# Patient Record
Sex: Female | Born: 1935 | Race: White | Hispanic: No | Smoking: Former smoker
Health system: Southern US, Community
[De-identification: ages and names within clinical notes are randomized; demographics above are authoritative.]

## PROBLEM LIST (undated history)

## (undated) DIAGNOSIS — R311 Benign essential microscopic hematuria: Secondary | ICD-10-CM

## (undated) DIAGNOSIS — F329 Major depressive disorder, single episode, unspecified: Secondary | ICD-10-CM

## (undated) DIAGNOSIS — D638 Anemia in other chronic diseases classified elsewhere: Secondary | ICD-10-CM

## (undated) DIAGNOSIS — C801 Malignant (primary) neoplasm, unspecified: Secondary | ICD-10-CM

## (undated) DIAGNOSIS — E890 Postprocedural hypothyroidism: Secondary | ICD-10-CM

## (undated) DIAGNOSIS — E079 Disorder of thyroid, unspecified: Secondary | ICD-10-CM

## (undated) DIAGNOSIS — E785 Hyperlipidemia, unspecified: Secondary | ICD-10-CM

## (undated) DIAGNOSIS — C8290 Follicular lymphoma, unspecified, unspecified site: Secondary | ICD-10-CM

## (undated) DIAGNOSIS — J449 Chronic obstructive pulmonary disease, unspecified: Secondary | ICD-10-CM

## (undated) DIAGNOSIS — E042 Nontoxic multinodular goiter: Secondary | ICD-10-CM

## (undated) DIAGNOSIS — I10 Essential (primary) hypertension: Secondary | ICD-10-CM

## (undated) DIAGNOSIS — N6320 Unspecified lump in the left breast, unspecified quadrant: Secondary | ICD-10-CM

## (undated) DIAGNOSIS — J9611 Chronic respiratory failure with hypoxia: Secondary | ICD-10-CM

## (undated) DIAGNOSIS — H2589 Other age-related cataract: Secondary | ICD-10-CM

## (undated) DIAGNOSIS — D020 Carcinoma in situ of larynx: Secondary | ICD-10-CM

## (undated) DIAGNOSIS — D649 Anemia, unspecified: Secondary | ICD-10-CM

## (undated) DIAGNOSIS — J181 Lobar pneumonia, unspecified organism: Secondary | ICD-10-CM

## (undated) HISTORY — PX: ABDOMINAL HYSTERECTOMY: SHX81

## (undated) HISTORY — DX: Malignant (primary) neoplasm, unspecified: C80.1

## (undated) HISTORY — DX: Follicular lymphoma, unspecified, unspecified site: C82.90

## (undated) HISTORY — DX: Major depressive disorder, single episode, unspecified: F32.9

## (undated) HISTORY — DX: Other age-related cataract: H25.89

## (undated) HISTORY — DX: Unspecified lump in the left breast, unspecified quadrant: N63.20

## (undated) HISTORY — DX: Postprocedural hypothyroidism: E89.0

## (undated) HISTORY — DX: Disorder of thyroid, unspecified: E07.9

## (undated) HISTORY — PX: TUBAL LIGATION: SHX77

## (undated) HISTORY — DX: Hyperlipidemia, unspecified: E78.5

## (undated) HISTORY — DX: Benign essential microscopic hematuria: R31.1

## (undated) HISTORY — DX: Anemia in other chronic diseases classified elsewhere: D63.8

## (undated) HISTORY — DX: Carcinoma in situ of larynx: D02.0

## (undated) HISTORY — DX: Anemia, unspecified: D64.9

## (undated) HISTORY — PX: THYROIDECTOMY: SHX17

## (undated) HISTORY — DX: Chronic obstructive pulmonary disease, unspecified: J44.9

## (undated) HISTORY — DX: Chronic respiratory failure with hypoxia: J96.11

## (undated) HISTORY — DX: Essential (primary) hypertension: I10

## (undated) HISTORY — DX: Nontoxic multinodular goiter: E04.2

## (undated) HISTORY — DX: Lobar pneumonia, unspecified organism: J18.1

---

## 2008-01-16 DIAGNOSIS — E042 Nontoxic multinodular goiter: Secondary | ICD-10-CM

## 2008-01-16 HISTORY — DX: Nontoxic multinodular goiter: E04.2

## 2011-06-12 DIAGNOSIS — I10 Essential (primary) hypertension: Secondary | ICD-10-CM

## 2011-06-12 DIAGNOSIS — R311 Benign essential microscopic hematuria: Secondary | ICD-10-CM

## 2011-06-12 HISTORY — DX: Essential (primary) hypertension: I10

## 2011-06-12 HISTORY — DX: Benign essential microscopic hematuria: R31.1

## 2011-08-10 DIAGNOSIS — R490 Dysphonia: Secondary | ICD-10-CM | POA: Insufficient documentation

## 2011-11-24 DIAGNOSIS — C320 Malignant neoplasm of glottis: Secondary | ICD-10-CM | POA: Insufficient documentation

## 2012-02-02 DIAGNOSIS — F329 Major depressive disorder, single episode, unspecified: Secondary | ICD-10-CM | POA: Insufficient documentation

## 2012-02-02 HISTORY — DX: Major depressive disorder, single episode, unspecified: F32.9

## 2015-11-07 DIAGNOSIS — H2589 Other age-related cataract: Secondary | ICD-10-CM

## 2015-11-07 HISTORY — DX: Other age-related cataract: H25.89

## 2016-03-08 DIAGNOSIS — R531 Weakness: Secondary | ICD-10-CM | POA: Insufficient documentation

## 2016-03-08 DIAGNOSIS — K922 Gastrointestinal hemorrhage, unspecified: Secondary | ICD-10-CM | POA: Insufficient documentation

## 2016-03-08 DIAGNOSIS — D500 Iron deficiency anemia secondary to blood loss (chronic): Secondary | ICD-10-CM | POA: Insufficient documentation

## 2016-08-04 DIAGNOSIS — C252 Malignant neoplasm of tail of pancreas: Secondary | ICD-10-CM | POA: Insufficient documentation

## 2016-08-04 DIAGNOSIS — N6320 Unspecified lump in the left breast, unspecified quadrant: Secondary | ICD-10-CM | POA: Insufficient documentation

## 2016-08-04 HISTORY — DX: Unspecified lump in the left breast, unspecified quadrant: N63.20

## 2016-08-21 DIAGNOSIS — J9621 Acute and chronic respiratory failure with hypoxia: Secondary | ICD-10-CM | POA: Insufficient documentation

## 2016-08-26 DIAGNOSIS — C8290 Follicular lymphoma, unspecified, unspecified site: Secondary | ICD-10-CM

## 2016-08-26 HISTORY — DX: Follicular lymphoma, unspecified, unspecified site: C82.90

## 2017-09-28 ENCOUNTER — Ambulatory Visit (INDEPENDENT_AMBULATORY_CARE_PROVIDER_SITE_OTHER): Admitting: Physician Assistant

## 2017-09-28 ENCOUNTER — Encounter: Payer: Self-pay | Admitting: Physician Assistant

## 2017-09-28 VITALS — BP 129/68 | HR 92 | Temp 98.3°F | Resp 18 | Ht 65.0 in | Wt 115.0 lb

## 2017-09-28 DIAGNOSIS — M1990 Unspecified osteoarthritis, unspecified site: Secondary | ICD-10-CM | POA: Insufficient documentation

## 2017-09-28 DIAGNOSIS — J449 Chronic obstructive pulmonary disease, unspecified: Secondary | ICD-10-CM | POA: Insufficient documentation

## 2017-09-28 DIAGNOSIS — E785 Hyperlipidemia, unspecified: Secondary | ICD-10-CM

## 2017-09-28 DIAGNOSIS — D020 Carcinoma in situ of larynx: Secondary | ICD-10-CM

## 2017-09-28 DIAGNOSIS — D500 Iron deficiency anemia secondary to blood loss (chronic): Secondary | ICD-10-CM

## 2017-09-28 DIAGNOSIS — J9611 Chronic respiratory failure with hypoxia: Secondary | ICD-10-CM

## 2017-09-28 DIAGNOSIS — Z87440 Personal history of urinary (tract) infections: Secondary | ICD-10-CM

## 2017-09-28 DIAGNOSIS — Z515 Encounter for palliative care: Secondary | ICD-10-CM

## 2017-09-28 DIAGNOSIS — R630 Anorexia: Secondary | ICD-10-CM

## 2017-09-28 DIAGNOSIS — R531 Weakness: Secondary | ICD-10-CM

## 2017-09-28 DIAGNOSIS — Z7689 Persons encountering health services in other specified circumstances: Secondary | ICD-10-CM

## 2017-09-28 DIAGNOSIS — E890 Postprocedural hypothyroidism: Secondary | ICD-10-CM

## 2017-09-28 DIAGNOSIS — F331 Major depressive disorder, recurrent, moderate: Secondary | ICD-10-CM

## 2017-09-28 HISTORY — DX: Carcinoma in situ of larynx: D02.0

## 2017-09-28 HISTORY — DX: Hyperlipidemia, unspecified: E78.5

## 2017-09-28 MED ORDER — MIRTAZAPINE 7.5 MG PO TABS: 7.5000 mg | ORAL_TABLET | Freq: Every day | ORAL | 0 refills | Status: DC

## 2017-09-28 MED ORDER — NONFORMULARY OR COMPOUNDED ITEM: 0 refills | Status: AC

## 2017-09-28 NOTE — Progress Notes (Signed)
HPI:                                                                Sheri Contreras is a 84 y.o. 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  She was most recently hospitalized on 07/30/17 for COPD exacerbation and CAP. She is currently doing well on home O2 5-8L, Breo, and Duonebs 4-5 times daily. Continues to smoke 1/2 ppd.  She is currently being treated with Bactrim (day 3) for catheter-associated UTI. Denies any dysuria, frequency, urgency, hesitancy. Denies fever, nightsweats or altered mental status  Main concern today is generalized weakness, poor appetite and difficulty sleeping. Son states she naps 1-2 times during the day and sleeps through the night 4-6 hours. Patient perceives that she is not sleeping well because she feels chronically fatigued. Current bedtime medications are Haldol 2 mg and Gabapentin 100 mg.   Depression screen PHQ 2/9 09/28/2017  Decreased Interest 2  Down, Depressed, Hopeless 2  PHQ - 2 Score 4  Altered sleeping 3  Tired, decreased energy 3  Change in appetite 1  Feeling bad or failure about yourself  3  Trouble concentrating 3  Moving slowly or fidgety/restless 1  Suicidal thoughts 0  PHQ-9 Score 18    GAD 7 : Generalized Anxiety Score 09/28/2017  Nervous, Anxious, on Edge 3  Control/stop worrying 3  Worry too much - different things 3  Trouble relaxing 3  Restless 3  Easily annoyed or irritable 3  Afraid - awful might happen 2  Total GAD 7 Score 20      History reviewed. No pertinent past medical history. Past Surgical History:  Procedure Laterality Date  . ABDOMINAL HYSTERECTOMY     . THYROIDECTOMY    . TUBAL LIGATION     Social History   Tobacco Use  . Smoking status: Former Smoker    Types: Cigarettes    Last attempt to quit: 07/28/2016    Years since quitting: 1.2  . Smokeless tobacco: Current User    Types: Snuff  Substance Use Topics  . Alcohol use: Not Currently   family history is not on file.    ROS: Review of Systems  Constitutional: Positive for malaise/fatigue.  Respiratory: Positive for shortness of breath and wheezing.   Gastrointestinal: Positive for nausea and vomiting.  Neurological: Positive for weakness.  Endo/Heme/Allergies: Bruises/bleeds easily.  Psychiatric/Behavioral: The patient is nervous/anxious and has insomnia.      Medications: Current Outpatient Medications  Medication Sig Dispense Refill  . fluticasone furoate-vilanterol (BREO ELLIPTA) 200-25 MCG/INH AEPB Inhale 1 puff into the lungs daily.    . haloperidol (HALDOL) 2 MG tablet Take 2 mg by mouth at bedtime.    Marland Kitchen ipratropium-albuterol (DUONEB) 0.5-2.5 (3) MG/3ML SOLN INHALE 1 VIAL VIA NEBULIZER FOUR TIMES DAILY AS NEEDED    . NONFORMULARY OR COMPOUNDED ITEM Portable battery oxygen concentrator    . NONFORMULARY OR COMPOUNDED ITEM Oxygen  and nasal cannula    . ondansetron (ZOFRAN-ODT) 4 MG disintegrating tablet Take 4 mg by mouth every 8 (eight) hours as needed.    Marland Kitchen Respiratory Therapy Supplies (NEBULIZER COMPRESSOR) KIT Use a directed    . Respiratory Therapy Supplies (NEBULIZER/ADULT MASK) KIT Use as directed    . senna (SENOKOT) 8.6 MG tablet Take 8.6 mg by mouth as needed.    Marland Kitchen albuterol (VENTOLIN HFA) 108 (90 Base) MCG/ACT inhaler Inhale into the lungs.    . gabapentin (NEURONTIN) 100 MG capsule Take 1 capsule (100 mg total) by mouth at bedtime. 90 capsule 1  . levothyroxine (SYNTHROID, LEVOTHROID) 75 MCG tablet Take 1 tablet (75 mcg total) by mouth daily before breakfast. 90 tablet 1  . loperamide (ANTI-DIARRHEAL) 2 MG tablet Take by mouth as needed.    Marland Kitchen  LORazepam (ATIVAN) 0.5 MG tablet Take 1 tablet (0.5 mg total) by mouth once as needed for up to 1 dose for anxiety (DO NOT USE FOR SLEEP). 30 tablet 0  . mirtazapine (REMERON) 15 MG tablet Take 1 tablet (15 mg total) by mouth at bedtime. 30 tablet 3  . NONFORMULARY OR COMPOUNDED ITEM Pulse oximeter, for continuous home use 1 each 0  . promethazine (PHENERGAN) 25 MG tablet Take 25 mg by mouth as needed.     No current facility-administered medications for this visit.    No Known Allergies     Objective:  BP 129/68   Pulse 92   Temp 98.3 F (36.8 C)   Resp 18   Ht '5\' 5"'$  (1.651 m)   Wt 115 lb (52.2 kg)   SpO2 99%   BMI 19.14 kg/m  Gen:  alert, elderly female seated in a wheelchair with nasal cannula, appears frail, not toxic-appearing, no acute distress HEENT: head normocephalic without obvious abnormality, conjunctiva and cornea clear, trachea midline Pulm: Normal work of breathing on 5L continuous O2 nasal cannula, normal phonation, diffusely coarse breath sounds CV: Normal rate, regular rhythm, s1 and s2 distinct, no murmurs, clicks or rubs  Neuro: alert and oriented x 3, no tremor Skin: intact, no rashes on exposed skin, no jaundice, no cyanosis Psych: well-groomed, cooperative, good eye contact, depressed mood, affect mood-congruent, speech is articulate, and thought processes clear and goal-directed    No results found for this or any previous visit (from the past 72 hour(s)). No results found.    Assessment and Plan: 84 y.o. female with   Encounter to establish care  Postsurgical hypothyroidism - Plan: TSH  Blood loss anemia - Plan: CBC with Differential/Platelet  Weakness generalized - Plan: COMPLETE METABOLIC PANEL WITH GFR, CBC with Differential/Platelet, Urinalysis, Routine w reflex microscopic, Urine Culture, CANCELED: Urine Culture  History of UTI - Plan: Urine Culture  Poor appetite - Plan: DISCONTINUED: mirtazapine (REMERON) 7.5 MG tablet  Moderate  episode of recurrent major depressive disorder (HCC) - Plan: DISCONTINUED: mirtazapine (REMERON) 7.5 MG tablet  Chronic respiratory failure with hypoxia ([place]) - Plan: NONFORMULARY OR COMPOUNDED ITEM  Hospice care patient  Vitals reviewed and stable, SpO2 99% on 5L [place] PHQ9=18, no acute safety issues Adding low-dose Mirtazapine 7.5 mg QHS to help with depression and improve appetite Checking CBC, CMP, TSH Fatigue is likely multifactorial (chronic hypoxia, anemia of chronic disease) Contacting [facility] (hosp[facility]) to establish as PCP    Patient education and anticipatory guidance given Patient agrees with treatment plan Follow-up in 2 weeks for depression/poor appetite or sooner as needed if symptoms worsen or fail to improve  Charley E.  Maisie Fus PA-C

## 2017-09-28 NOTE — Patient Instructions (Addendum)
-   continue Haldol 2 mg and Gabapentin 100 mg at bedtime - start Mirtazapine 7.5 mg at bedtime for 1 week. If tolerating and still having sleep difficulty, increase to 2 tablets (15 mg) at bedtime

## 2017-09-29 LAB — CBC WITH DIFFERENTIAL/PLATELET
BASOS ABS: 27 {cells}/uL (ref 0–200)
Basophils Relative: 0.3 %
EOS PCT: 2 %
Eosinophils Absolute: 178 cells/uL (ref 15–500)
HCT: 31.8 % — ABNORMAL LOW (ref 35.0–45.0)
Hemoglobin: 9.9 g/dL — ABNORMAL LOW (ref 11.7–15.5)
LYMPHS ABS: 2875 {cells}/uL (ref 850–3900)
MCH: 25 pg — ABNORMAL LOW (ref 27.0–33.0)
MCHC: 31.1 g/dL — AB (ref 32.0–36.0)
MCV: 80.3 fL (ref 80.0–100.0)
MPV: 10 fL (ref 7.5–12.5)
Monocytes Relative: 11.5 %
NEUTROS PCT: 53.9 %
Neutro Abs: 4797 cells/uL (ref 1500–7800)
PLATELETS: 362 10*3/uL (ref 140–400)
RBC: 3.96 10*6/uL (ref 3.80–5.10)
RDW: 19 % — AB (ref 11.0–15.0)
TOTAL LYMPHOCYTE: 32.3 %
WBC: 8.9 10*3/uL (ref 3.8–10.8)
WBCMIX: 1024 {cells}/uL — AB (ref 200–950)

## 2017-09-29 LAB — URINALYSIS, ROUTINE W REFLEX MICROSCOPIC
BACTERIA UA: NONE SEEN /HPF
BILIRUBIN URINE: NEGATIVE
Glucose, UA: NEGATIVE
Hyaline Cast: NONE SEEN /LPF
KETONES UR: NEGATIVE
Nitrite: NEGATIVE
Protein, ur: NEGATIVE
SPECIFIC GRAVITY, URINE: 1.019 (ref 1.001–1.03)
pH: 6.5 (ref 5.0–8.0)

## 2017-09-29 LAB — COMPLETE METABOLIC PANEL WITH GFR
AG RATIO: 1.1 (calc) (ref 1.0–2.5)
ALBUMIN MSPROF: 3.6 g/dL (ref 3.6–5.1)
ALT: 11 U/L (ref 6–29)
AST: 17 U/L (ref 10–35)
Alkaline phosphatase (APISO): 92 U/L (ref 33–130)
BUN / CREAT RATIO: 10 (calc) (ref 6–22)
BUN: 11 mg/dL (ref 7–25)
CALCIUM: 9.3 mg/dL (ref 8.6–10.4)
CO2: 27 mmol/L (ref 20–32)
CREATININE: 1.12 mg/dL — AB (ref 0.60–0.88)
Chloride: 100 mmol/L (ref 98–110)
GFR, EST AFRICAN AMERICAN: 52 mL/min/{1.73_m2} — AB (ref >=60)
GFR, Est Non African American: 45 mL/min/{1.73_m2} — ABNORMAL LOW (ref >=60)
Globulin: 3.3 g/dL (calc) (ref 1.9–3.7)
Glucose, Bld: 81 mg/dL (ref 65–99)
POTASSIUM: 3.4 mmol/L — AB (ref 3.5–5.3)
SODIUM: 136 mmol/L (ref 135–146)
TOTAL PROTEIN: 6.9 g/dL (ref 6.1–8.1)
Total Bilirubin: 0.2 mg/dL (ref 0.2–1.2)

## 2017-09-29 LAB — TEST AUTHORIZATION

## 2017-09-29 LAB — IRON,TIBC AND FERRITIN PANEL
%SAT: 14 % — AB (ref 16–45)
FERRITIN: 20 ng/mL (ref 16–288)
Iron: 43 ug/dL — ABNORMAL LOW (ref 45–160)
TIBC: 301 mcg/dL (calc) (ref 250–450)

## 2017-09-29 LAB — URINE CULTURE
MICRO NUMBER:: 90789645
RESULT: NO GROWTH
SPECIMEN QUALITY: ADEQUATE

## 2017-09-29 LAB — TSH: TSH: 3.18 mIU/L (ref 0.40–4.50)

## 2017-10-01 ENCOUNTER — Encounter: Payer: Self-pay | Admitting: Physician Assistant

## 2017-10-01 DIAGNOSIS — D638 Anemia in other chronic diseases classified elsewhere: Secondary | ICD-10-CM | POA: Insufficient documentation

## 2017-10-01 HISTORY — DX: Anemia in other chronic diseases classified elsewhere: D63.8

## 2017-10-01 NOTE — Progress Notes (Signed)
Chronic kidney disease is stable, potassium is a little low. Recommend increasing dietary potassium Anemia has improved. I would recommend continuing iron supplement at least 3 days per week Thyroid function is good. Continue current dose of Levothyroxine Urine culture did not grow any bacteria. This means the antibiotic is working and she should complete it. I also reached out to [facility] to let them know that I am taking over as her PCP. They should be reaching out to sign medical releases  Should patient ask about sources of dietary potassium: Vegetables including broccoli, peas, lima beans, tomatoes, potatoes (particularly their skins), sweet potatoes, and winter squash are all good sources of potassium. Fruits that contain significant amounts of potassium include citrus fruits, cantaloupe, bananas, kiwi, prunes, and apricots.

## 2017-10-04 ENCOUNTER — Other Ambulatory Visit: Payer: Self-pay

## 2017-10-04 ENCOUNTER — Other Ambulatory Visit: Payer: Self-pay | Admitting: Physician Assistant

## 2017-10-04 ENCOUNTER — Telehealth: Payer: Self-pay

## 2017-10-04 DIAGNOSIS — F331 Major depressive disorder, recurrent, moderate: Secondary | ICD-10-CM

## 2017-10-04 DIAGNOSIS — R630 Anorexia: Secondary | ICD-10-CM

## 2017-10-04 MED ORDER — LORAZEPAM 0.5 MG PO TABS: 0.5000 mg | ORAL_TABLET | Freq: Once | ORAL | 0 refills | Status: DC | PRN

## 2017-10-04 MED ORDER — MIRTAZAPINE 15 MG PO TABS: 15.0000 mg | ORAL_TABLET | Freq: Every day | ORAL | 3 refills | Status: DC

## 2017-10-04 MED ORDER — AMBULATORY NON FORMULARY MEDICATION: 0 refills | Status: DC

## 2017-10-04 MED ORDER — GABAPENTIN 100 MG PO CAPS: 100.0000 mg | ORAL_CAPSULE | Freq: Every day | ORAL | 1 refills | Status: DC

## 2017-10-04 MED ORDER — LEVOTHYROXINE SODIUM 75 MCG PO TABS: 75.0000 ug | ORAL_TABLET | Freq: Every day | ORAL | 1 refills | Status: DC

## 2017-10-04 NOTE — Telephone Encounter (Signed)
Hospice nurse [name] is asking for new orders.  Artesha is requesting to go back on .5 of alprazolam.  She was on the liquid when she was in the hospital.  They also pointed out that Dominica Severin picked up a Rx of trazodone that was prescribed by old PCP.  [name] said that they have been giving Jenness both trazodone and Remeron.  [name] said it seemed to be working fine for her but wanted to know should they continue to giver her both? -EH/RMA

## 2017-10-04 NOTE — Telephone Encounter (Signed)
Please discontinue Trazodone Only nighttime medications should be Haldol 2 mg, Gabapentin 100 mg and Remeron 7.5 mg I am refilling Lorazepam 0.5 mg. This should NOT be used for sleep. It should only be used as needed for severe anxiety. It should not be combined with any other sedating medications. This is especially important because of her chronic respiratory failure.

## 2017-10-04 NOTE — Telephone Encounter (Signed)
Hospice nurse advised of recommendations.

## 2017-10-05 NOTE — Telephone Encounter (Signed)
Error -EH/RMA

## 2017-10-12 ENCOUNTER — Ambulatory Visit: Payer: Medicare HMO | Admitting: Physician Assistant

## 2017-10-16 ENCOUNTER — Encounter: Payer: Self-pay | Admitting: Physician Assistant

## 2017-10-16 DIAGNOSIS — J9611 Chronic respiratory failure with hypoxia: Secondary | ICD-10-CM

## 2017-10-16 DIAGNOSIS — E890 Postprocedural hypothyroidism: Secondary | ICD-10-CM

## 2017-10-16 DIAGNOSIS — R630 Anorexia: Secondary | ICD-10-CM | POA: Insufficient documentation

## 2017-10-16 DIAGNOSIS — Z87440 Personal history of urinary (tract) infections: Secondary | ICD-10-CM | POA: Insufficient documentation

## 2017-10-16 DIAGNOSIS — Z515 Encounter for palliative care: Secondary | ICD-10-CM | POA: Insufficient documentation

## 2017-10-16 HISTORY — DX: Chronic respiratory failure with hypoxia: J96.11

## 2017-10-16 HISTORY — DX: Postprocedural hypothyroidism: E89.0

## 2017-10-19 ENCOUNTER — Ambulatory Visit (INDEPENDENT_AMBULATORY_CARE_PROVIDER_SITE_OTHER): Payer: Medicare HMO | Admitting: Physician Assistant

## 2017-10-19 ENCOUNTER — Encounter: Payer: Self-pay | Admitting: Physician Assistant

## 2017-10-19 VITALS — BP 158/74 | HR 97 | Resp 18 | Wt 126.0 lb

## 2017-10-19 DIAGNOSIS — F341 Dysthymic disorder: Secondary | ICD-10-CM

## 2017-10-19 DIAGNOSIS — F064 Anxiety disorder due to known physiological condition: Secondary | ICD-10-CM | POA: Insufficient documentation

## 2017-10-19 NOTE — Progress Notes (Signed)
HPI:                                                                Sheri Contreras is a 84 y.o. 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  She was started on Remeron 15 mg nightly 2 weeks ago for poor appetite and sleep difficulties. Reports she is tolerating this medication without difficulty and eating a little more. Current bedtime medications are Haldol 2 mg and Gabapentin 100 mg. Typical bedtime 1am, wakes around 9a-10a. Feels rested. Reports mood is "pretty good." Has not need any Lorazepam for anxiety. Not napping  She has not been using supplemental oxygen for the last week. Denies dyspnea, coughing, chest tightness or wheezing.  Depression screen [facility] 2/9 10/19/2017 09/28/2017  Decreased Interest 1 2  Down, Depressed, Hopeless 1 2  PHQ - 2 Score 2 4  Altered sleeping 3 3  Tired, decreased energy 3 3  Change in appetite 1 1  Feeling bad or failure about yourself  1 3  Trouble concentrating 1 3  Moving slowly or fidgety/restless 2 1  Suicidal thoughts 0 0  PHQ-9 Score 13 18    GAD 7 : Generalized Anxiety Score 10/19/2017 09/28/2017  Nervous, Anxious, on Edge 2 3  Control/stop worrying 2 3  Worry too much - different things 2 3  Trouble relaxing 2 3  Restless 2 3  Easily annoyed or irritable 2 3  Afraid - awful might happen 2 2  Total GAD 7 Score 14 20      History reviewed. No pertinent past medical history. Past Surgical History:  Procedure Laterality Date  . ABDOMINAL HYSTERECTOMY    . THYROIDECTOMY    . TUBAL LIGATION     Social History   Tobacco Use  . Smoking status: Former Smoker    Types: Cigarettes    Last attempt to quit:  07/28/2016    Years since quitting: 1.2  . Smokeless tobacco: Current User    Types: Snuff  Substance Use Topics  . Alcohol use: Not Currently   family history is not on file.    ROS: negative except as noted in the HPI  Medications: Current Outpatient Medications  Medication Sig Dispense Refill  . albuterol (VENTOLIN HFA) 108 (90 Base) MCG/ACT inhaler Inhale into the lungs.    . fluticasone furoate-vilanterol (BREO ELLIPTA) 200-25 MCG/INH AEPB Inhale 1 puff into the lungs daily.    Marland Kitchen gabapentin (NEURONTIN) 100 MG capsule Take 1 capsule (100 mg total) by mouth at bedtime. 90 capsule 1  . haloperidol (HALDOL) 2 MG tablet Take 2 mg by mouth at bedtime.    Marland Kitchen ipratropium-albuterol (DUONEB) 0.5-2.5 (3) MG/3ML SOLN INHALE 1 VIAL VIA NEBULIZER FOUR TIMES DAILY AS NEEDED    . levothyroxine (SYNTHROID, LEVOTHROID) 75 MCG tablet Take 1 tablet (75 mcg total) by mouth daily before breakfast. 90 tablet 1  . loperamide (ANTI-DIARRHEAL) 2 MG tablet Take by mouth as needed.    Marland Kitchen LORazepam (ATIVAN) 0.5  MG tablet Take 1 tablet (0.5 mg total) by mouth once as needed for up to 1 dose for anxiety (DO NOT USE FOR SLEEP). 30 tablet 0  . mirtazapine (REMERON) 15 MG tablet Take 1 tablet (15 mg total) by mouth at bedtime. 30 tablet 3  . NONFORMULARY OR COMPOUNDED ITEM Portable battery oxygen concentrator    . NONFORMULARY OR COMPOUNDED ITEM Oxygen and nasal cannula    . NONFORMULARY OR COMPOUNDED ITEM Pulse oximeter, for continuous home use 1 each 0  . ondansetron (ZOFRAN-ODT) 4 MG disintegrating tablet Take 4 mg by mouth every 8 (eight) hours as needed.    . promethazine (PHENERGAN) 25 MG tablet Take 25 mg by mouth as needed.    Marland Kitchen Respiratory Therapy Supplies (NEBULIZER COMPRESSOR) KIT Use a directed    . Respiratory Therapy Supplies (NEBULIZER/ADULT MASK) KIT Use as directed    . senna (SENOKOT) 8.6 MG tablet Take 8.6 mg by mouth as needed.     No current facility-administered medications for this visit.     No Known Allergies     Objective:  BP (!) 158/74   Pulse 97   Resp 18   Wt 126 lb (57.2 kg) Comment: wheelchair  SpO2 94%   BMI 20.97 kg/m  Gen:  alert, not ill-appearing, no distress, appropriate for age, seated in wheelchair HEENT: head normocephalic without obvious abnormality, conjunctiva and cornea clear, trachea midline Pulm: Normal work of breathing, normal phonation, breath sounds diffusely coarse, no wheezes, rales or rhonchi CV: Normal rate, regular rhythm, s1 and s2 distinct, no murmurs, clicks or rubs  Neuro: alert and oriented x 3, no tremor MSK: extremities atraumatic, normal gait and station Skin: intact, no rashes on exposed skin, no jaundice, no cyanosis Psych: well-groomed, cooperative, good eye contact, euthymic mood, affect mood-congruent, speech is articulate, and thought processes clear and goal-directed    No results found for this or any previous visit (from the past 72 hour(s)). No results found.    Assessment and Plan: 84 y.o. female with   Dysthymia  Anxiety disorder due to known physiological condition  PHQ9=13, no acute safety issues GAD7=14 Cont Remeron 15 mg QHS Cont Haldol 2 mg QHS Cont Gabapentin 100 mg QHS  SpO2 94% on RA at rest, no tachypnea. Continue supplemental O2 2-5L/min prn for SpO2 <90%  Patient education and anticipatory guidance given Patient agrees with treatment plan Follow-up in 3 months for medication management or sooner as needed if symptoms worsen or fail to improve  Darlyne Russian PA-C

## 2017-10-19 NOTE — Patient Instructions (Addendum)
Continue Bedtime Medications - Mirtazapine 15 mg - Haloperidol 2 mg - Gabapentin 100 mg

## 2017-10-27 ENCOUNTER — Other Ambulatory Visit: Payer: Self-pay | Admitting: Physician Assistant

## 2017-10-27 DIAGNOSIS — R630 Anorexia: Secondary | ICD-10-CM

## 2017-10-27 DIAGNOSIS — F331 Major depressive disorder, recurrent, moderate: Secondary | ICD-10-CM

## 2018-01-04 ENCOUNTER — Ambulatory Visit (INDEPENDENT_AMBULATORY_CARE_PROVIDER_SITE_OTHER): Payer: Medicare Other | Admitting: Physician Assistant

## 2018-01-04 ENCOUNTER — Encounter: Payer: Self-pay | Admitting: Physician Assistant

## 2018-01-04 VITALS — BP 131/62 | HR 89 | Temp 97.6°F | Ht 65.0 in | Wt 125.0 lb

## 2018-01-04 DIAGNOSIS — Z23 Encounter for immunization: Secondary | ICD-10-CM

## 2018-01-04 DIAGNOSIS — I10 Essential (primary) hypertension: Secondary | ICD-10-CM

## 2018-01-04 DIAGNOSIS — C252 Malignant neoplasm of tail of pancreas: Secondary | ICD-10-CM

## 2018-01-04 DIAGNOSIS — J411 Mucopurulent chronic bronchitis: Secondary | ICD-10-CM

## 2018-01-04 DIAGNOSIS — Z87440 Personal history of urinary (tract) infections: Secondary | ICD-10-CM

## 2018-01-04 DIAGNOSIS — R350 Frequency of micturition: Secondary | ICD-10-CM

## 2018-01-04 DIAGNOSIS — R109 Unspecified abdominal pain: Secondary | ICD-10-CM

## 2018-01-04 LAB — POCT URINALYSIS DIPSTICK
BILIRUBIN UA: NEGATIVE
GLUCOSE UA: NEGATIVE
KETONES UA: NEGATIVE
NITRITE UA: NEGATIVE
Protein, UA: POSITIVE — AB
Spec Grav, UA: 1.025 (ref 1.010–1.025)
UROBILINOGEN UA: 0.2 U/dL
pH, UA: 5.5 (ref 5.0–8.0)

## 2018-01-04 MED ORDER — NITROFURANTOIN MONOHYD MACRO 100 MG PO CAPS: 100.0000 mg | ORAL_CAPSULE | Freq: Two times a day (BID) | ORAL | 0 refills | Status: DC

## 2018-01-04 MED ORDER — HYDROCODONE-ACETAMINOPHEN 5-325 MG PO TABS: 1.0000 | ORAL_TABLET | Freq: Three times a day (TID) | ORAL | 0 refills | Status: AC | PRN

## 2018-01-04 MED ORDER — TAMSULOSIN HCL 0.4 MG PO CAPS: 0.4000 mg | ORAL_CAPSULE | Freq: Every day | ORAL | 0 refills | Status: AC

## 2018-01-04 MED ORDER — SULFAMETHOXAZOLE-TRIMETHOPRIM 800-160 MG PO TABS: 1.0000 | ORAL_TABLET | Freq: Two times a day (BID) | ORAL | 0 refills | Status: DC

## 2018-01-04 NOTE — Progress Notes (Signed)
Subjective:     Patient ID: Sheri Contreras, female   DOB: 1931/02/20, 84 y.o.   MRN: [id number]  HPI Patient is a 84 yo female with a history of metastatic pancreatic cancer, throat cancer, squamous cell carcinoma of the vocal cord, COPD, and UTIs who presents today complaining of low back pain and a possible UTI. Patient reports that she has had several UTIs before due to the fact that she had a catheter in May while was on hospice for pancreatic cancer. She is no longer on hospice and is living with her son. She states the pain started on Saturday and was mostly in her back. She states that the pain is in her left side flank and describes it as a sharp pain. She states on Sunday it was so severe that it was about a 9-10/10 and her son thought he was going to have to take her to the ER. Since then the pain has gone down to a 7/10 and she now feels some pain in her side. She denies any abdominal pain. She complains of some dysuria and urgency. She denies hematuria. She denies fever, nausea, or vomiting, but has had some diarrhea which she feels is normal for her. She hasn't taken anything for the pain.   .. Active Ambulatory Problems    Diagnosis Date Noted  . Acute on chronic respiratory failure with hypoxia ([place]) 08/21/2016  . Arthritis 09/28/2017  . Benign microscopic hematuria 06/12/2011  . Blood loss anemia 03/08/2016  . Breast mass, left 08/04/2016  . COPD (chronic obstructive pulmonary disease) ([place]) 09/28/2017  . Depression, major 02/02/2012  . Essential hypertension 06/12/2011  . Weakness generalized 03/08/2016  . Follicular lymphoma ([place]) 08/26/2016  . GI bleeding 03/08/2016  . Hoarseness 08/10/2011  . Hyperlipidemia 09/28/2017  . Nontoxic multinodular goiter 01/16/2008  . Other and combined forms of senile cataract 11/07/2015  . Malignant neoplasm of tail of pancreas ([place]) 08/04/2016  . Squamous cell carcinoma of vocal cord ([place]) 11/24/2011  . Throat cancer ([place]) 09/28/2017   . Anemia of chronic disease 10/01/2017  . Hospice care patient 10/16/2017  . Chronic respiratory failure with hypoxia ([place]) 10/16/2017  . Poor appetite 10/16/2017  . History of UTI 10/16/2017  . Postsurgical hypothyroidism 10/16/2017  . Dysthymia 10/19/2017  . Anxiety disorder due to known physiological condition 10/19/2017   Resolved Ambulatory Problems    Diagnosis Date Noted  . No Resolved Ambulatory Problems   No Additional Past Medical History    Review of Systems  All other systems reviewed and are negative.    Objective:   Physical Exam  Constitutional: She is oriented to person, place, and time.  HENT:  Head: Normocephalic and atraumatic.  Cardiovascular: Normal rate and regular rhythm.  Pulmonary/Chest: She has rales in the right lower field and the left lower field.  Abdominal: She exhibits no distension. There is no tenderness. There is CVA tenderness.  CVA test elicited more tenderness on left side compared to right.  Neurological: She is alert and oriented to person, place, and time.  Psychiatric: She has a normal mood and affect. Her behavior is normal.      Assessment:     Marland KitchenMarland KitchenDiagnoses and all orders for this visit:  Urine frequency -     POCT Urinalysis Dipstick -     Urine Culture -     sulfamethoxazole-trimethoprim (BACTRIM DS,SEPTRA DS) 800-160 MG tablet; Take 1 tablet by mouth 2 (two) times daily.  History of UTI -  sulfamethoxazole-trimethoprim (BACTRIM DS,SEPTRA DS) 800-160 MG tablet; Take 1 tablet by mouth 2 (two) times daily.  Malignant neoplasm of tail of pancreas (HCC)  Mucopurulent chronic bronchitis ([place])  Essential hypertension  Encounter for immunization -     Flu vaccine HIGH DOSE PF  Left flank pain -     tamsulosin (FLOMAX) 0.4 MG CAPS capsule; Take 1 capsule (0.4 mg total) by mouth daily. -     HYDROcodone-acetaminophen (NORCO/VICODIN) 5-325 MG tablet; Take 1-2 tablets by mouth every 8 (eight) hours as needed for up to 5  days for moderate pain. -     sulfamethoxazole-trimethoprim (BACTRIM DS,SEPTRA DS) 800-160 MG tablet; Take 1 tablet by mouth 2 (two) times daily.  Need for influenza vaccination -     Flu vaccine HIGH DOSE PF  Other orders -     Discontinue: nitrofurantoin, macrocrystal-monohydrate, (MACROBID) 100 MG capsule; Take 1 capsule (100 mg total) by mouth 2 (two) times daily.      .. Results for orders placed or performed in visit on 01/04/18  POCT Urinalysis Dipstick  Result Value Ref Range   Color, UA yellow    Clarity, UA clear    Glucose, UA Negative Negative   Bilirubin, UA negative    Ketones, UA negative    Spec Grav, UA 1.025 1.010 - 1.025   Blood, UA moderate    pH, UA 5.5 5.0 - 8.0   Protein, UA Positive (A) Negative   Urobilinogen, UA 0.2 0.2 or 1.0 E.U./dL   Nitrite, UA negative    Leukocytes, UA Small (1+) (A) Negative   Appearance     Odor      Plan:    Dipstick done in office today showed moderate blood, positive for protein, and 1+ leukocytes. Going to treat patient for a UTI today. Bilateral crackles heard in lower lungs so treating with Bactrim to cover for UTI as well as any possible pneumonia.  Urine culture ordered.   Given patients presentation of left sided low back pain, going to prescribe Flomax to help with passage of any possible stones. Do not think imaging is warranted at this time given patient's age, presentation, and history of cancer. Also prescribed hydrocodone-acetaminophen to use as needed to help with pain.  If back pain persists after use of Bactrim and Flomax will likely need further evaluation to rule out kidney stone or metastatic cause. Advised patient to continue to drink lots of fluids which she states she is doing. Patient has an appointment in 2 weeks and can discuss symptoms then, but if symptoms worsen from now until then, patient should call the office.    Pt is still being seen by hospice once a week.   Marland KitchenVernetta Honey PA-C, have  reviewed and agree with the above documentation in it's entirety.

## 2018-01-04 NOTE — Patient Instructions (Signed)
Possible kidney stone- flomax for 15 days.   Urinary Tract Infection, Adult A urinary tract infection (UTI) is an infection of any part of the urinary tract. The urinary tract includes the:  Kidneys.  Ureters.  Bladder.  Urethra.  These organs make, store, and get rid of pee (urine) in the body. Follow these instructions at home:  Take over-the-counter and prescription medicines only as told by your doctor.  If you were prescribed an antibiotic medicine, take it as told by your doctor. Do not stop taking the antibiotic even if you start to feel better.  Avoid the following drinks: ? Alcohol. ? Caffeine. ? Tea. ? Carbonated drinks.  Drink enough fluid to keep your pee clear or pale yellow.  Keep all follow-up visits as told by your doctor. This is important.  Make sure to: ? Empty your bladder often and completely. Do not to hold pee for long periods of time. ? Empty your bladder before and after sex. ? Wipe from front to back after a bowel movement if you are female. Use each tissue one time when you wipe. Contact a doctor if:  You have back pain.  You have a fever.  You feel sick to your stomach (nauseous).  You throw up (vomit).  Your symptoms do not get better after 3 days.  Your symptoms go away and then come back. Get help right away if:  You have very bad back pain.  You have very bad lower belly (abdominal) pain.  You are throwing up and cannot keep down any medicines or water. This information is not intended to replace advice given to you by your health care provider. Make sure you discuss any questions you have with your health care provider. Document Released: 09/02/2007 Document Revised: 08/22/2015 Document Reviewed: 02/04/2015 Elsevier Interactive Patient Education  [employer].

## 2018-01-06 LAB — URINE CULTURE
MICRO NUMBER: 91209252
SPECIMEN QUALITY: ADEQUATE

## 2018-01-06 NOTE — Progress Notes (Signed)
Call pt: no bacteria to treat in urine culture.

## 2018-01-19 ENCOUNTER — Encounter: Payer: Self-pay | Admitting: Physician Assistant

## 2018-01-19 ENCOUNTER — Ambulatory Visit (INDEPENDENT_AMBULATORY_CARE_PROVIDER_SITE_OTHER): Payer: Medicare Other | Admitting: Physician Assistant

## 2018-01-19 VITALS — BP 146/75 | HR 90 | Wt 136.0 lb

## 2018-01-19 DIAGNOSIS — F341 Dysthymic disorder: Secondary | ICD-10-CM | POA: Diagnosis not present

## 2018-01-19 DIAGNOSIS — F064 Anxiety disorder due to known physiological condition: Secondary | ICD-10-CM | POA: Diagnosis not present

## 2018-01-19 DIAGNOSIS — M159 Polyosteoarthritis, unspecified: Secondary | ICD-10-CM

## 2018-01-19 DIAGNOSIS — M150 Primary generalized (osteo)arthritis: Secondary | ICD-10-CM | POA: Diagnosis not present

## 2018-01-19 MED ORDER — ACETAMINOPHEN ER 650 MG PO TBCR: 650.0000 mg | EXTENDED_RELEASE_TABLET | Freq: Three times a day (TID) | ORAL | 3 refills | Status: AC | PRN

## 2018-01-19 NOTE — Patient Instructions (Signed)

## 2018-01-19 NOTE — Progress Notes (Signed)
HPI:                                                                Sheri Contreras is a 84 y.o. female who presents to [facility]: [place] today for depression / anxiety follow-up  Sheri Contreras is doing great. She is able to ambulate without assistance. She no longer requires home oxygen. She is still on hospice care through [facility]. Lives at home with her son, Sheri Contreras.  Stable on Remeron, Haldol, and Gabapentin. Sleeping soundly through the night. She has no complaints regarding her mood or anxiety.  Reports she is having arthritis pain in her low back and shoulder. Not currently taking anything for pain.    Depression screen [employer] 2/9 01/19/2018 10/19/2017 09/28/2017  Decreased Interest '1 1 2  '$ Down, Depressed, Hopeless '1 1 2  '$ PHQ - 2 Score '2 2 4  '$ Altered sleeping 0 3 3  Tired, decreased energy '1 3 3  '$ Change in appetite 0 1 1  Feeling bad or failure about yourself  '1 1 3  '$ Trouble concentrating 0 1 3  Moving slowly or fidgety/restless '1 2 1  '$ Suicidal thoughts 0 0 0  PHQ-9 Score '5 13 18    '$ GAD 7 : Generalized Anxiety Score 01/19/2018 10/19/2017 09/28/2017  Nervous, Anxious, on Edge '2 2 3  '$ Control/stop worrying '1 2 3  '$ Worry too much - different things '1 2 3  '$ Trouble relaxing '1 2 3  '$ Restless '2 2 3  '$ Easily annoyed or irritable '1 2 3  '$ Afraid - awful might happen '1 2 2  '$ Total GAD 7 Score '9 14 20      '$ No past medical history on file. Past Surgical History:  Procedure Laterality Date  . ABDOMINAL HYSTERECTOMY    . THYROIDECTOMY    . TUBAL LIGATION     Social History   Tobacco Use  . Smoking status: Former Smoker    Types: Cigarettes    Last attempt to quit: 07/28/2016    Years since quitting: 1.4  . Smokeless tobacco: Current User    Types: Snuff  Substance Use Topics  . Alcohol use: Not Currently   family history is not on file.    ROS: negative except as noted in the HPI  Medications: Current Outpatient Medications  Medication Sig  Dispense Refill  . albuterol (VENTOLIN HFA) 108 (90 Base) MCG/ACT inhaler Inhale into the lungs.    . fluticasone furoate-vilanterol (BREO ELLIPTA) 200-25 MCG/INH AEPB Inhale 1 puff into the lungs daily.    Marland Kitchen gabapentin (NEURONTIN) 100 MG capsule Take 1 capsule (100 mg total) by mouth at bedtime. 90 capsule 1  . haloperidol (HALDOL) 2 MG tablet Take 2 mg by mouth at bedtime.    Marland Kitchen ipratropium-albuterol (DUONEB) 0.5-2.5 (3) MG/3ML SOLN INHALE 1 VIAL VIA NEBULIZER FOUR TIMES DAILY AS NEEDED    . levothyroxine (SYNTHROID, LEVOTHROID) 75 MCG tablet Take 1 tablet (75 mcg total) by mouth daily before breakfast. 90 tablet 1  . loperamide (ANTI-DIARRHEAL) 2 MG tablet Take by mouth as needed.    Marland Kitchen LORazepam (ATIVAN) 0.5 MG tablet Take 1 tablet (0.5 mg total) by mouth once as needed for up to 1 dose for anxiety (DO NOT USE FOR SLEEP). 30 tablet  0  . mirtazapine (REMERON) 15 MG tablet TAKE 1 TABLET BY MOUTH EVERYDAY AT BEDTIME 90 tablet 2  . NONFORMULARY OR COMPOUNDED ITEM Portable battery oxygen concentrator    . NONFORMULARY OR COMPOUNDED ITEM Oxygen and nasal cannula    . NONFORMULARY OR COMPOUNDED ITEM Pulse oximeter, for continuous home use 1 each 0  . ondansetron (ZOFRAN-ODT) 4 MG disintegrating tablet Take 4 mg by mouth every 8 (eight) hours as needed.    . promethazine (PHENERGAN) 25 MG tablet Take 25 mg by mouth as needed.    Marland Kitchen Respiratory Therapy Supplies (NEBULIZER COMPRESSOR) KIT Use a directed    . Respiratory Therapy Supplies (NEBULIZER/ADULT MASK) KIT Use as directed    . senna (SENOKOT) 8.6 MG tablet Take 8.6 mg by mouth as needed.    . sulfamethoxazole-trimethoprim (BACTRIM DS,SEPTRA DS) 800-160 MG tablet Take 1 tablet by mouth 2 (two) times daily. 14 tablet 0  . tamsulosin (FLOMAX) 0.4 MG CAPS capsule Take 1 capsule (0.4 mg total) by mouth daily. 15 capsule 0   No current facility-administered medications for this visit.    No Known Allergies     Objective:  BP (!) 146/75   Pulse  90   Wt 136 lb (61.7 kg)   BMI 22.63 kg/m  Gen:  alert, not ill-appearing, no distress, appropriate for age 12: head normocephalic without obvious abnormality, conjunctiva and cornea clear, trachea midline Pulm: Normal work of breathing, normal phonation, clear to auscultation bilaterally, no wheezes, rales or rhonchi CV: Normal rate, regular rhythm, s1 and s2 distinct, no murmurs, clicks or rubs  Neuro: alert and oriented x 3, no tremor MSK: extremities atraumatic, antalgic gait and station Back: atraumatic, no midline tenderness, mild tenderness of bilateral lower lumbar/sacral region Skin: intact, no rashes on exposed skin, no jaundice, no cyanosis Psych: well-groomed, cooperative, good eye contact, euthymic mood, affect mood-congruent, speech is articulate, and thought processes clear and goal-directed    No results found for this or any previous visit (from the past 72 hour(s)). No results found.    Assessment and Plan: 84 y.o. female with   .Diagnoses and all orders for this visit:  Dysthymia  Primary osteoarthritis involving multiple joints -     acetaminophen (TYLENOL) 650 MG CR tablet; Take 1-2 tablets (650-1,300 mg total) by mouth every 8 (eight) hours as needed for pain.  Anxiety disorder due to known physiological condition   Dysthymia/Anxiety PHQ9=5, mild; improved from 13 several months ago GAD7=9, mild-moderate, improved from 14 Cont Remeron 15 mg QHS, Haldol 2 mg QHS and Gabapentin 100 mg QHS Cont low-dose Lorazepam 0.5 mg prn for breakthrough anxiety. She is not using this for sleep  OA Start Tylenol CR [phone] Q8H prn for pain Will avoid NSAIDs due to age, history of GI bleed/anemia and CKD  Son would like to change her pharmacy. I instructed him to have old pharmacy transfer all prescriptions to new pharmacy.  Patient education and anticipatory guidance given Patient agrees with treatment plan Follow-up in 3 months or sooner as needed if  symptoms worsen or fail to improve  Darlyne Russian PA-C

## 2018-01-20 ENCOUNTER — Encounter: Payer: Self-pay | Admitting: Physician Assistant

## 2018-01-20 DIAGNOSIS — M159 Polyosteoarthritis, unspecified: Secondary | ICD-10-CM | POA: Insufficient documentation

## 2018-01-20 DIAGNOSIS — M150 Primary generalized (osteo)arthritis: Secondary | ICD-10-CM

## 2018-02-22 ENCOUNTER — Encounter: Payer: Self-pay | Admitting: Physician Assistant

## 2018-02-22 ENCOUNTER — Ambulatory Visit (INDEPENDENT_AMBULATORY_CARE_PROVIDER_SITE_OTHER): Payer: Medicare Other | Admitting: Physician Assistant

## 2018-02-22 ENCOUNTER — Ambulatory Visit (INDEPENDENT_AMBULATORY_CARE_PROVIDER_SITE_OTHER)

## 2018-02-22 VITALS — BP 127/70 | HR 89 | Temp 97.9°F | Wt 144.0 lb

## 2018-02-22 DIAGNOSIS — R3129 Other microscopic hematuria: Secondary | ICD-10-CM

## 2018-02-22 DIAGNOSIS — M79601 Pain in right arm: Secondary | ICD-10-CM

## 2018-02-22 DIAGNOSIS — R82998 Other abnormal findings in urine: Secondary | ICD-10-CM

## 2018-02-22 DIAGNOSIS — E890 Postprocedural hypothyroidism: Secondary | ICD-10-CM

## 2018-02-22 DIAGNOSIS — Z8719 Personal history of other diseases of the digestive system: Secondary | ICD-10-CM

## 2018-02-22 DIAGNOSIS — R0902 Hypoxemia: Secondary | ICD-10-CM | POA: Diagnosis not present

## 2018-02-22 DIAGNOSIS — R5383 Other fatigue: Secondary | ICD-10-CM

## 2018-02-22 DIAGNOSIS — R918 Other nonspecific abnormal finding of lung field: Secondary | ICD-10-CM

## 2018-02-22 DIAGNOSIS — R5381 Other malaise: Secondary | ICD-10-CM

## 2018-02-22 DIAGNOSIS — J411 Mucopurulent chronic bronchitis: Secondary | ICD-10-CM | POA: Diagnosis not present

## 2018-02-22 DIAGNOSIS — J9611 Chronic respiratory failure with hypoxia: Secondary | ICD-10-CM

## 2018-02-22 DIAGNOSIS — J441 Chronic obstructive pulmonary disease with (acute) exacerbation: Secondary | ICD-10-CM

## 2018-02-22 LAB — COMPLETE METABOLIC PANEL WITH GFR
AG RATIO: 1.4 (calc) (ref 1.0–2.5)
ALT: 6 U/L (ref 6–29)
AST: 10 U/L (ref 10–35)
Albumin: 3.9 g/dL (ref 3.6–5.1)
Alkaline phosphatase (APISO): 101 U/L (ref 33–130)
BUN/Creatinine Ratio: 14 (calc) (ref 6–22)
BUN: 13 mg/dL (ref 7–25)
CALCIUM: 9.3 mg/dL (ref 8.6–10.4)
CO2: 33 mmol/L — ABNORMAL HIGH (ref 20–32)
Chloride: 101 mmol/L (ref 98–110)
Creat: 0.93 mg/dL — ABNORMAL HIGH (ref 0.60–0.88)
GFR, Est African American: 65 mL/min/{1.73_m2} (ref >=60)
GFR, Est Non African American: 56 mL/min/{1.73_m2} — ABNORMAL LOW (ref >=60)
GLOBULIN: 2.8 g/dL (ref 1.9–3.7)
Glucose, Bld: 106 mg/dL — ABNORMAL HIGH (ref 65–99)
POTASSIUM: 3.6 mmol/L (ref 3.5–5.3)
SODIUM: 138 mmol/L (ref 135–146)
Total Bilirubin: 0.3 mg/dL (ref 0.2–1.2)
Total Protein: 6.7 g/dL (ref 6.1–8.1)

## 2018-02-22 LAB — POCT URINALYSIS DIPSTICK
BILIRUBIN UA: NEGATIVE
GLUCOSE UA: NEGATIVE
Ketones, UA: NEGATIVE
Nitrite, UA: NEGATIVE
Protein, UA: NEGATIVE
Spec Grav, UA: 1.02 (ref 1.010–1.025)
Urobilinogen, UA: 0.2 E.U./dL
pH, UA: 5.5 (ref 5.0–8.0)

## 2018-02-22 LAB — CBC WITH DIFFERENTIAL/PLATELET
BASOS ABS: 27 {cells}/uL (ref 0–200)
Basophils Relative: 0.3 %
EOS ABS: 117 {cells}/uL (ref 15–500)
Eosinophils Relative: 1.3 %
HEMATOCRIT: 37 % (ref 35.0–45.0)
Hemoglobin: 12 g/dL (ref 11.7–15.5)
LYMPHS ABS: 2331 {cells}/uL (ref 850–3900)
MCH: 27.5 pg (ref 27.0–33.0)
MCHC: 32.4 g/dL (ref 32.0–36.0)
MCV: 84.7 fL (ref 80.0–100.0)
MPV: 9.2 fL (ref 7.5–12.5)
Monocytes Relative: 14.5 %
NEUTROS PCT: 58 %
Neutro Abs: 5220 cells/uL (ref 1500–7800)
Platelets: 337 10*3/uL (ref 140–400)
RBC: 4.37 10*6/uL (ref 3.80–5.10)
RDW: 15.4 % — AB (ref 11.0–15.0)
Total Lymphocyte: 25.9 %
WBC: 9 10*3/uL (ref 3.8–10.8)
WBCMIX: 1305 {cells}/uL — AB (ref 200–950)

## 2018-02-22 LAB — TSH+FREE T4: TSH W/REFLEX TO FT4: 2.64 mIU/L (ref 0.40–4.50)

## 2018-02-22 MED ORDER — AMOXICILLIN-POT CLAVULANATE 875-125 MG PO TABS: 1.0000 | ORAL_TABLET | Freq: Two times a day (BID) | ORAL | 0 refills | Status: DC

## 2018-02-22 MED ORDER — METHYLPREDNISOLONE SODIUM SUCC 125 MG IJ SOLR
125.0000 mg | Freq: Once | INTRAMUSCULAR | Status: AC
  Administered 2018-02-22: 125 mg via INTRAMUSCULAR

## 2018-02-22 MED ORDER — FLUTICASONE-UMECLIDIN-VILANT 100-62.5-25 MCG/INH IN AEPB: 1.0000 | INHALATION_SPRAY | Freq: Every day | RESPIRATORY_TRACT | 5 refills | Status: AC

## 2018-02-22 MED ORDER — METHYLPREDNISOLONE ACETATE 40 MG/ML IJ SUSP
40.0000 mg | Freq: Once | INTRAMUSCULAR | Status: AC
  Administered 2018-02-22: 40 mg via INTRAMUSCULAR

## 2018-02-22 NOTE — Patient Instructions (Addendum)
Stop Breo and switch to Trelegy inhaler for COPD Start home O2, 2L, and adjust so that pulse ox is 92% or above Start antibiotic - this will cover you for respiratory infection, ear infection and urinary tract infection  You should be receiving your portable O2 from [facility] this week. Please contact us Friday if you have not heard anything   Chronic Obstructive Pulmonary Disease Exacerbation Chronic obstructive pulmonary disease (COPD) is a common lung problem. In COPD, the flow of air from the lungs is limited. COPD exacerbations are times that breathing gets worse and you need extra treatment. Without treatment they can be life threatening. If they happen often, your lungs can become more damaged. If your COPD gets worse, your doctor may treat you with:  Medicines.  Oxygen.  Different ways to clear your airway, such as using a mask.  Follow these instructions at home:  Do not smoke.  Avoid tobacco smoke and other things that bother your lungs.  If given, take your antibiotic medicine as told. Finish the medicine even if you start to feel better.  Only take medicines as told by your doctor.  Drink enough fluids to keep your pee (urine) clear or pale yellow (unless your doctor has told you not to).  Use a cool mist machine (vaporizer).  If you use oxygen or a machine that turns liquid medicine into a mist (nebulizer), continue to use them as told.  Keep up with shots (vaccinations) as told by your doctor.  Exercise regularly.  Eat healthy foods.  Keep all doctor visits as told. Get help right away if:  You are very short of breath and it gets worse.  You have trouble talking.  You have bad chest pain.  You have blood in your spit (sputum).  You have a fever.  You keep throwing up (vomiting).  You feel weak, or you pass out (faint).  You feel confused.  You keep getting worse. This information is not intended to replace advice given to you by your health care  provider. Make sure you discuss any questions you have with your health care provider. Document Released: 03/05/2011 Document Revised: 08/22/2015 Document Reviewed: 11/18/2012 Elsevier Interactive Patient Education  2017 [employer].

## 2018-02-22 NOTE — Progress Notes (Signed)
HPI:                                                                Sheri Contreras is a 84 y.o. female who presents to [facility]: [place] today for URI symptoms and right arm pain  URI   This is a new problem. The current episode started more than 1 month ago. The problem has been gradually worsening. There has been no fever. Associated symptoms include congestion, coughing (productive of sputum), ear pain (right), nausea (yesterday), rhinorrhea, a sore throat (x 2 weeks) and wheezing. Pertinent negatives include no abdominal pain, chest pain, diarrhea, dysuria, headaches, rash or vomiting. Associated symptoms comments: + shortness of breath. She has tried acetaminophen Nurse, adult) for the symptoms. The treatment provided mild relief.  Arm Pain   The incident occurred more than 1 week ago. There was no injury mechanism. The pain is present in the upper right arm. The quality of the pain is described as aching. The pain does not radiate. The pain is moderate. The pain has been constant since the incident. Associated symptoms include muscle weakness. Pertinent negatives include no chest pain, numbness or tingling. Associated symptoms comments: + decreased ROM. The symptoms are aggravated by movement and lifting. She has tried acetaminophen for the symptoms. The treatment provided mild relief.     Past Medical History:  Diagnosis Date  . Anemia   . Cancer ([place])   . COPD (chronic obstructive pulmonary disease) ([place])   . Thyroid disease    Past Surgical History:  Procedure Laterality Date  . ABDOMINAL HYSTERECTOMY    . THYROIDECTOMY    . TUBAL LIGATION     Social History   Tobacco Use  . Smoking status: Former Smoker    Types: Cigarettes    Last attempt to quit: 07/28/2016    Years since quitting: 1.5  . Smokeless tobacco: Current User    Types: Snuff  Substance Use Topics  . Alcohol use: Not Currently   Family history is unknown by  patient.    ROS: negative except as noted in the HPI  Medications: Current Outpatient Medications  Medication Sig Dispense Refill  . acetaminophen (TYLENOL) 650 MG CR tablet Take 1-2 tablets (650-1,300 mg total) by mouth every 8 (eight) hours as needed for pain. 90 tablet 3  . albuterol (VENTOLIN HFA) 108 (90 Base) MCG/ACT inhaler Inhale into the lungs.    Marland Kitchen amoxicillin-clavulanate (AUGMENTIN) 875-125 MG tablet Take 1 tablet by mouth 2 (two) times daily. 20 tablet 0  . Fluticasone-Umeclidin-Vilant (TRELEGY ELLIPTA) 100-62.5-25 MCG/INH AEPB Inhale 1 puff into the lungs daily. 60 each 5  . gabapentin (NEURONTIN) 100 MG capsule Take 1 capsule (100 mg total) by mouth at bedtime. 90 capsule 1  . haloperidol (HALDOL) 2 MG tablet Take 2 mg by mouth at bedtime.    Marland Kitchen ipratropium-albuterol (DUONEB) 0.5-2.5 (3) MG/3ML SOLN INHALE 1 VIAL VIA NEBULIZER FOUR TIMES DAILY AS NEEDED    . levothyroxine (SYNTHROID, LEVOTHROID) 75 MCG tablet Take 1 tablet (75 mcg total) by mouth daily before breakfast. 90 tablet 1  . loperamide (ANTI-DIARRHEAL) 2 MG tablet Take by mouth as needed.    Marland Kitchen LORazepam (ATIVAN) 0.5 MG tablet Take 1 tablet (0.5 mg total) by  mouth once as needed for up to 1 dose for anxiety (DO NOT USE FOR SLEEP). 30 tablet 0  . mirtazapine (REMERON) 15 MG tablet TAKE 1 TABLET BY MOUTH EVERYDAY AT BEDTIME 90 tablet 2  . NONFORMULARY OR COMPOUNDED ITEM Portable battery oxygen concentrator    . NONFORMULARY OR COMPOUNDED ITEM Oxygen and nasal cannula    . NONFORMULARY OR COMPOUNDED ITEM Pulse oximeter, for continuous home use 1 each 0  . ondansetron (ZOFRAN-ODT) 4 MG disintegrating tablet Take 4 mg by mouth every 8 (eight) hours as needed.    . promethazine (PHENERGAN) 25 MG tablet Take 25 mg by mouth as needed.    Marland Kitchen Respiratory Therapy Supplies (NEBULIZER COMPRESSOR) KIT Use a directed    . Respiratory Therapy Supplies (NEBULIZER/ADULT MASK) KIT Use as directed    . senna (SENOKOT) 8.6 MG tablet Take  8.6 mg by mouth as needed.    . tamsulosin (FLOMAX) 0.4 MG CAPS capsule Take 1 capsule (0.4 mg total) by mouth daily. 15 capsule 0   No current facility-administered medications for this visit.    No Known Allergies     Objective:  BP 127/70   Pulse 89   Temp 97.9 F (36.6 C) (Oral)   Wt 144 lb (65.3 kg)   SpO2 91%   BMI 23.96 kg/m  Gen:  alert, not ill-appearing, no distress, appropriate for age 81: head normocephalic without obvious abnormality, conjunctiva and cornea clear, right TM slightly dark and opaque, no erythema or bulging, no mastoid tenderness, oropharynx clear, moist mucous membranes, neck supple, no cervical adenopathy, trachea midline Pulm: Normal work of breathing, normal phonation, breath sounds diffusely diminished, no wheezes, rales or rhonchi CV: Normal rate, regular rhythm, s1 and s2 distinct, no murmurs, clicks or rubs  Neuro: alert and oriented x 3, no tremor MSK: extremities atraumatic, normal gait and station Right upper extremity: atraumatic, no tenderness or deformity, positive Neers sign, decreased shoulder flexion to 110 degrees Skin: intact, no rashes on exposed skin, no jaundice, no cyanosis     Results for orders placed or performed in visit on 02/22/18 (from the past 72 hour(s))  POCT Urinalysis Dipstick     Status: Abnormal   Collection Time: 02/22/18 10:05 AM  Result Value Ref Range   Color, UA yellow    Clarity, UA clear    Glucose, UA Negative Negative   Bilirubin, UA negative    Ketones, UA negative    Spec Grav, UA 1.020 1.010 - 1.025   Blood, UA moderate    pH, UA 5.5 5.0 - 8.0   Protein, UA Negative Negative   Urobilinogen, UA 0.2 0.2 or 1.0 E.U./dL   Nitrite, UA negative    Leukocytes, UA Small (1+) (A) Negative   Appearance     Odor     No results found.    Assessment and Plan: 84 y.o. female with   .Diagnoses and all orders for this visit:  Chronic respiratory failure with hypoxia (HCC)  Malaise and  fatigue -     DG Chest 2 View -     CBC with Differential/Platelet -     COMPLETE METABOLIC PANEL WITH GFR -     TSH + free T4  Hypoxia -     DG Chest 2 View -     CBC with Differential/Platelet  Mucopurulent chronic bronchitis (HCC) -     DG Chest 2 View -     Fluticasone-Umeclidin-Vilant (TRELEGY ELLIPTA) 100-62.5-25 MCG/INH AEPB; Inhale 1 puff into  the lungs daily.  History of GI bleed -     CBC with Differential/Platelet  Postsurgical hypothyroidism -     TSH + free T4  Microscopic hematuria -     POCT Urinalysis Dipstick -     Urine Culture  Urine leukocytes -     amoxicillin-clavulanate (AUGMENTIN) 875-125 MG tablet; Take 1 tablet by mouth 2 (two) times daily. -     POCT Urinalysis Dipstick -     Urine Culture  Chronic obstructive pulmonary disease with acute exacerbation (HCC) -     amoxicillin-clavulanate (AUGMENTIN) 875-125 MG tablet; Take 1 tablet by mouth 2 (two) times daily.  Pain of right upper extremity -     Ambulatory referral to Home Health   Acute on chronic respiratory failure, Malaise/Fatigue Afebrile, no tachypnea, no tachycardia, hypoxic with pulse ox 91% on RA at rest Patient states she is not using her home oxygen because the supplier took away her portable O2 Contacted [facility] and they will deliver a new device this week In the meantime, I have instructed her to use her non-portable home oxygen starting 2L nasal cannula and self-titrating to pulse ox>=92% Given that patient has had generalized malaise and URI symptoms for >1 month, will obtain infection work-up to include CXR, CBC w diff, CMP, urine culture. She also complains of constant cold chills, checking TSH UA positive for moderate blood and small leuks. She has a history of asymptomatic benign microscopic hematuria. Augmentin prescribed to cover both respiratory and genitourinary pathogens  Pain of right upper extremity Pain in the biceps area. Patient is convinced this was due to  administration of her flu shot over 1 month ago. Differential includes adhesive capsulitis, biceps tendonitis. Recommend home PT and follow-up in 4 weeks  Patient education and anticipatory guidance given Patient agrees with treatment plan Follow-up in 3 days or sooner as needed if symptoms worsen or fail to improve  Darlyne Russian PA-C

## 2018-02-23 ENCOUNTER — Telehealth: Payer: Self-pay | Admitting: Physician Assistant

## 2018-02-23 LAB — URINE CULTURE
MICRO NUMBER: 91424947
SPECIMEN QUALITY: ADEQUATE

## 2018-02-23 NOTE — Telephone Encounter (Signed)
[name], can you provide clarification on the home health PT referral?  We need to figure out how to get her access to PT.  [name], can you contact [facility] today? Let them know one of their patients needs rehab for right arm pain and ask how we can order this for them

## 2018-02-23 NOTE — Telephone Encounter (Signed)
Sheri Contreras   I spoke with Rise Paganini the team lead at [facility] and she stated that in order for the patient to have in home rehab she would have to come off of the hospice program. They are able to go do an evaluation and maybe 1-2 visits and they do go out and train family how to lift and move patients  but are not able to set up 2 weeks of rehab. She did state that you could look at other interventions for this but if the patient is needing rehab it is not a qualified hospice service. - CF

## 2018-02-23 NOTE — Progress Notes (Signed)
Chest x-ray shows a probable pneumonia in the right lung. The antibiotic she received should treat this. I would recommend we repeat a chest x-ray in 4 weeks to ensure this has resolved.  I would also like her to come back into the office in 1 week to make sure she is improving clinically.

## 2018-02-23 NOTE — Telephone Encounter (Signed)
Error

## 2018-02-23 NOTE — Telephone Encounter (Signed)
Let patient know we are unable to get home health PT for the below reasons. I would recommend she return to see Sports Medicine regarding her arm pain. They could give her instruction on home rehab exercises and pain management

## 2018-02-28 ENCOUNTER — Ambulatory Visit (INDEPENDENT_AMBULATORY_CARE_PROVIDER_SITE_OTHER): Admitting: Physician Assistant

## 2018-02-28 ENCOUNTER — Encounter: Payer: Self-pay | Admitting: Physician Assistant

## 2018-02-28 VITALS — BP 152/65 | HR 82 | Temp 97.9°F | Wt 143.0 lb

## 2018-02-28 DIAGNOSIS — J181 Lobar pneumonia, unspecified organism: Secondary | ICD-10-CM

## 2018-02-28 DIAGNOSIS — J9611 Chronic respiratory failure with hypoxia: Secondary | ICD-10-CM

## 2018-02-28 DIAGNOSIS — Z8719 Personal history of other diseases of the digestive system: Secondary | ICD-10-CM | POA: Insufficient documentation

## 2018-02-28 DIAGNOSIS — J189 Pneumonia, unspecified organism: Secondary | ICD-10-CM

## 2018-02-28 NOTE — Progress Notes (Signed)
HPI:                                                                Sheri Contreras is a 84 y.o. 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 She was treated empirically with Augmentin for possible co-existing UTI.  She was also switched from Anoro to Trelegy for her COPD.  CBC, CMP, TSH were unremarkable. Urine culture was negative.   Reports she is feeling much better.  She has not needed her home oxygen.  She denies fever. chills, chest pain, hemoptysis.  Additionally she had also complained of right upper arm pain.  We had referred her for home health physical therapy, but unfortunately her hospice agency [facility] stated she was not eligible for PT. She states pain has completely resolved without treatment.  Past Medical History:  Diagnosis Date  . Anemia   . Cancer ([place])   . COPD (chronic obstructive pulmonary disease) ([place])   . Thyroid disease    Past Surgical History:  Procedure Laterality Date  . ABDOMINAL HYSTERECTOMY    . THYROIDECTOMY    . TUBAL LIGATION     Social History   Tobacco Use  . Smoking status: Former Smoker    Types: Cigarettes    Last attempt to quit: 07/28/2016    Years since quitting: 1.5  . Smokeless tobacco: Current User    Types: Snuff  Substance Use Topics  . Alcohol use: Not Currently   Family history is unknown by patient.    ROS: negative except as noted in the HPI  Medications: Current Outpatient Medications  Medication  Sig Dispense Refill  . acetaminophen (TYLENOL) 650 MG CR tablet Take 1-2 tablets (650-1,300 mg total) by mouth every 8 (eight) hours as needed for pain. 90 tablet 3  . albuterol (VENTOLIN HFA) 108 (90 Base) MCG/ACT inhaler Inhale into the lungs.    Marland Kitchen amoxicillin-clavulanate (AUGMENTIN) 875-125 MG tablet Take 1 tablet by mouth 2 (two) times daily. 20 tablet 0  . Fluticasone-Umeclidin-Vilant (TRELEGY ELLIPTA) 100-62.5-25 MCG/INH AEPB Inhale 1 puff into the lungs daily. 60 each 5  . gabapentin (NEURONTIN) 100 MG capsule Take 1 capsule (100 mg total) by mouth at bedtime. 90 capsule 1  . haloperidol (HALDOL) 2 MG tablet Take 2 mg by mouth at bedtime.    Marland Kitchen ipratropium-albuterol (DUONEB) 0.5-2.5 (3) MG/3ML SOLN INHALE 1 VIAL VIA NEBULIZER FOUR TIMES DAILY AS NEEDED    . levothyroxine (SYNTHROID, LEVOTHROID) 75 MCG tablet Take 1 tablet (75 mcg total) by mouth daily before breakfast. 90 tablet 1  . loperamide (ANTI-DIARRHEAL) 2 MG tablet Take by mouth as needed.    Marland Kitchen LORazepam (ATIVAN) 0.5 MG tablet Take 1 tablet (0.5 mg total) by mouth once as needed for up to 1 dose for anxiety (DO NOT USE FOR SLEEP). 30 tablet 0  . mirtazapine (  REMERON) 15 MG tablet TAKE 1 TABLET BY MOUTH EVERYDAY AT BEDTIME 90 tablet 2  . NONFORMULARY OR COMPOUNDED ITEM Portable battery oxygen concentrator    . NONFORMULARY OR COMPOUNDED ITEM Oxygen and nasal cannula    . NONFORMULARY OR COMPOUNDED ITEM Pulse oximeter, for continuous home use 1 each 0  . ondansetron (ZOFRAN-ODT) 4 MG disintegrating tablet Take 4 mg by mouth every 8 (eight) hours as needed.    . promethazine (PHENERGAN) 25 MG tablet Take 25 mg by mouth as needed.    Marland Kitchen Respiratory Therapy Supplies (NEBULIZER COMPRESSOR) KIT Use a directed    . Respiratory Therapy Supplies (NEBULIZER/ADULT MASK) KIT Use as directed    . senna (SENOKOT) 8.6 MG tablet Take 8.6 mg by mouth as needed.    . tamsulosin (FLOMAX) 0.4 MG CAPS capsule Take 1 capsule (0.4 mg total) by mouth daily.  15 capsule 0   No current facility-administered medications for this visit.    No Known Allergies     Objective:  BP (!) 152/65   Pulse 82   Temp 97.9 F (36.6 C) (Oral)   Wt 143 lb (64.9 kg)   SpO2 94%   BMI 23.80 kg/m  Gen:  alert, not ill-appearing, no distress, appropriate for age 72: head normocephalic without obvious abnormality, conjunctiva and cornea clear, trachea midline Pulm: Normal work of breathing, normal phonation, breath sounds diffusely diminished, no wheezes, rales or rhonchi CV: Normal rate, regular rhythm, s1 and s2 distinct, no murmurs, clicks or rubs  Neuro: alert and oriented x 3, no tremor MSK: extremities atraumatic, normal gait and station Skin: intact, no rashes on exposed skin, no jaundice, no cyanosis  Study Result   CLINICAL DATA:  Productive cough for several weeks  EXAM: CHEST - 2 VIEW  COMPARISON:  None.  FINDINGS: Cardiac shadows within normal limits. The lungs are hyperinflated consistent with COPD. Mild diffuse interstitial changes are noted likely of a chronic nature. A more focal area of increased density is noted anteriorly on the lateral projection. This is not well appreciated on the frontal film but may represent a focal infiltrate in the lingula or right middle lobe. No sizable effusion is seen. No bony abnormality is noted.  IMPRESSION: Focal area of increased density best seen anteriorly on the lateral projection. This may lie within the medial aspect of the right middle lobe although not well seen on the frontal projection.  Followup PA and lateral chest X-ray is recommended in 3-4 weeks following trial of antibiotic therapy to ensure resolution and exclude underlying malignancy.   Electronically Signed   By: Inez Catalina M.D.   On: 02/22/2018 10:49     No results found for this or any previous visit (from the past 72 hour(s)). No results found.    Assessment and Plan: 84 y.o. female with    .Sheri Contreras was seen today for follow-up.  Diagnoses and all orders for this visit:  Community acquired pneumonia of right middle lobe of lung ([place]) -     DG Chest 2 View; Future  Chronic respiratory failure with hypoxia ([facility])   Afebrile, no tachypnea, no tachycardia, pulse ox 94% on room air at rest, this is improved from 91% on 02/22/2018.  Clinically improved. Complete full 10-day course of Augmentin Continue Trelegy for COPD Repeat chest x-ray to assess for resolution of infiltrate in 1 month    Patient education and anticipatory guidance given Patient agrees with treatment plan Follow-up in 1 month or sooner as needed if  symptoms worsen or fail to improve  Darlyne Russian PA-C

## 2018-02-28 NOTE — Telephone Encounter (Signed)
Discussed during office visit today -EH/RMA

## 2018-02-28 NOTE — Patient Instructions (Addendum)
Return for repeat Chest x-ray in 4 weeks   Chronic Respiratory Failure Respiratory failure is a condition in which the lungs do not work well and the breathing (respiratory) system fails. When respiratory failure occurs, it becomes difficult for the lungs to get enough oxygen or to eliminate carbon dioxide or to do both duties. If the lungs do not work properly, the heart, brain, and other body systems do not get enough oxygen. Respiratory failure is life-threatening if it is not treated. Respiratory failure can be acute or chronic. Acute respiratory failure is sudden and severe and requires emergency medical treatment. Chronic respiratory failure happens over time, usually due to a medical condition that gets worse. What are the causes? This condition may be caused by any problem that affects the heart or lungs. Causes include:  Chronic bronchitis and emphysema (COPD).  Pulmonary fibrosis.  Water in the lungs due to heart failure, lung injury, or infection (pulmonary edema).  Asthma.  Nerve or muscle diseases that make chest movements difficult, such as Leta Baptist disease or Guillain-Barre syndrome.  A collapsed lung (pneumothorax).  Pulmonary hypertension.  Chronic sleep apnea.  Pneumonia.  Obesity.  A blood clot in a lung (pulmonary embolism).  Trauma to the chest that makes breathing difficult.  What increases the risk? You are more likely to develop this condition if:  You are a smoker, or have a history of smoking.  You have a weak immune system.  You have a family history of breathing problems or lung disease.  You have a long term lung disease such as COPD.  What are the signs or symptoms? Symptoms of this condition include:  Shortness of breath with or without activity.  Difficulty breathing.  Wheezing.  A fast or irregular heartbeat (arrhythmia).  Chest pain or tightness.  A bluish color to the fingernail or toenail beds  (cyanosis).  Confusion.  Drowsiness.  Extreme fatigue, especially with minimal activity.  How is this diagnosed? This condition may be diagnosed based on:  Your medical history.  A physical exam.  Other tests, such as: ? A chest X-ray. ? A CT scan of your lungs. ? Blood tests, such as an arterial blood gas test. This test is done to check if you have enough oxygen in your blood. ? An electrocardiogram. This test records the electrical activity of your heart. ? An echocardiogram. This test uses sound waves to produce an image of your heart.  A check of your blood pressure, heart rate, breathing rate, and blood oxygen level.  How is this treated? Treatment for this condition depends on the cause. Treatment can include the following:  Getting oxygen through a nasal cannula. This is a tube that goes in your nose.  Getting oxygen through a face mask.  Receiving noninvasive positive pressure ventilation. This is a method of breathing support in which a machine blows air into your lungs through a mask. The machine allows you to breathe on your own. It helps the body take in oxygen and eliminate carbon dioxide.  Using a ventilator. This is a breathing machine that delivers oxygen to the lungs through a breathing tube that is put into the trachea. This machine is used when you can no longer breathe well enough on your own.  Medicines to help with breathing, such as: ? Medicines that open up and relax air passages, such as bronchodilators. These may be given through a device that turns liquid medicines into a mist you can breathe in (nebulizer). These medicines  help with breathing. ? Diuretics. These medicines get rid of extra fluid out of your lungs, which can help you breathe better. ? Steroid medicines. These decrease inflammation in the lungs. ? Antibiotic medicines. These may be given to treat a bacterial infection, such as pneumonia.  Pulmonary rehabilitation. This is an exercise  program that strengthens the muscles in your chest and helps you learn breathing techniques in order to manage your condition.  Follow these instructions at home: Medicines  Take over-the-counter and prescription medicines only as told by your health care provider.  If you were prescribed an antibiotic medicine, take it as told by your health care provider. Do not stop taking the antibiotic even if you start to feel better. General instructions  Use oxygen therapy and pulmonary rehabilitation if directed to by your health care provider. If you require home oxygen therapy, ask your health care provider whether you should purchase a pulse oximeter to measure your oxygen level at home.  Work with your health care provider to create a plan to help you deal with your condition. Follow this plan.  Do not use any products that contain nicotine or tobacco, such as cigarettes and e-cigarettes. If you need help quitting, ask your health care provider.  Avoid exposure to irritants that make your breathing problems worse. These include smoke, chemicals, and fumes.  Stay active, but balance activity with periods of rest. Exercise and physical activity will help you maintain your ability to do things you want to do.  Stay up to date on all vaccines, especially yearly influenza and pneumonia vaccines.  Avoid people who are sick as well as crowded places during the flu season.  Keep all follow-up visits as told by your health care provider. This is important. Contact a health care provider if:  Your shortness of breath gets worse and you cannot do the things you used to do.  You have increased mucus (sputum), wheezing, coughing, or loss of energy.  You are on oxygen therapy and you are starting to need more.  You need to use your medicines more often.  You have a fever. Get help right away if:  Your shortness of breath becomes worse.  You are unable to say more than a few words without having  to catch your breath.  You develop chest pain or tightness. Summary  Respiratory failure is a condition in which the lungs do not work well and the breathing system fails.  This condition can be very serious and is often life-threatening.  This condition is diagnosed with tests and can be treated with medicines or oxygen.  Contact a health care provider if your shortness of breath gets worse or if you need to use your oxygen or medicines more often than before. This information is not intended to replace advice given to you by your health care provider. Make sure you discuss any questions you have with your health care provider. Document Released: 03/16/2005 Document Revised: 03/27/2016 Document Reviewed: 03/27/2016 Elsevier Interactive Patient Education  2017 [employer].

## 2018-03-02 DIAGNOSIS — J181 Lobar pneumonia, unspecified organism: Principal | ICD-10-CM

## 2018-03-02 DIAGNOSIS — J189 Pneumonia, unspecified organism: Secondary | ICD-10-CM | POA: Insufficient documentation

## 2018-03-02 HISTORY — DX: Pneumonia, unspecified organism: J18.9

## 2018-04-21 ENCOUNTER — Ambulatory Visit (INDEPENDENT_AMBULATORY_CARE_PROVIDER_SITE_OTHER): Payer: Medicare Other

## 2018-04-21 ENCOUNTER — Ambulatory Visit (INDEPENDENT_AMBULATORY_CARE_PROVIDER_SITE_OTHER): Payer: Medicare Other | Admitting: Physician Assistant

## 2018-04-21 ENCOUNTER — Encounter: Payer: Self-pay | Admitting: Physician Assistant

## 2018-04-21 VITALS — BP 113/76 | HR 89 | Temp 97.9°F | Wt 143.0 lb

## 2018-04-21 DIAGNOSIS — L989 Disorder of the skin and subcutaneous tissue, unspecified: Secondary | ICD-10-CM

## 2018-04-21 DIAGNOSIS — J449 Chronic obstructive pulmonary disease, unspecified: Secondary | ICD-10-CM | POA: Diagnosis not present

## 2018-04-21 DIAGNOSIS — J029 Acute pharyngitis, unspecified: Secondary | ICD-10-CM | POA: Diagnosis not present

## 2018-04-21 DIAGNOSIS — J189 Pneumonia, unspecified organism: Secondary | ICD-10-CM

## 2018-04-21 DIAGNOSIS — J181 Lobar pneumonia, unspecified organism: Principal | ICD-10-CM

## 2018-04-21 DIAGNOSIS — R918 Other nonspecific abnormal finding of lung field: Secondary | ICD-10-CM | POA: Diagnosis not present

## 2018-04-21 DIAGNOSIS — H9201 Otalgia, right ear: Secondary | ICD-10-CM

## 2018-04-21 MED ORDER — IPRATROPIUM BROMIDE 0.06 % NA SOLN: 2.0000 | Freq: Four times a day (QID) | NASAL | 0 refills | Status: DC | PRN

## 2018-04-21 MED ORDER — TRIAMCINOLONE ACETONIDE 0.5 % EX OINT: 1.0000 "application " | TOPICAL_OINTMENT | Freq: Two times a day (BID) | CUTANEOUS | 0 refills | Status: AC

## 2018-04-21 MED ORDER — MAGIC MOUTHWASH W/LIDOCAINE: 15.0000 mL | Freq: Four times a day (QID) | ORAL | 0 refills | Status: AC | PRN

## 2018-04-21 NOTE — Patient Instructions (Addendum)
Go downstairs for chest x-ray today  For sore throat: - Rinse mouth really well after using Trelegy - Use Magic Mouthwash - swish, gargle and spit 30 ml every 4-6 hours as needed - Tylenol 1000mg  every 8 hours as needed for throat pain. Alternate with Ibuprofen 600mg  every 6 hours - Cepacol throat lozenges and/or Chloraseptic spray - Warm salt water gargles - Use nasal spray up to 4 times daily

## 2018-04-21 NOTE — Progress Notes (Signed)
HPI:                                                                PURVI RUEHL is a 85 y.o. 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 She was treated empirically with Augmentin for possible co-existing UTI.  She was also switched from Anoro to Trelegy for her COPD. She did not have her follow-up chest x-ray completed  Today she c/o of approx 3 weeks of right ear pain and right-sided throat pain. Associated with rhinorrhea. Denies fever, chills, malaise, dysphagia, sinus pressure, headache, jaw/dental pain. She is a former smoker, but son who lives with her continues to smoke so she has chronic passive smoke exposure.   She also reports an itchy lesion on her right cheek that she has been scratching and picking at.  Depression screen [facility] 2/9 01/19/2018 10/19/2017 09/28/2017  Decreased Interest '1 1 2  '$ Down, Depressed, Hopeless '1 1 2  '$ PHQ - 2 Score '2 2 4  '$ Altered sleeping 0 3 3  Tired, decreased energy '1 3 3  '$ Change in appetite 0 1 1  Feeling bad or failure about yourself  '1 1 3  '$ Trouble concentrating 0 1 3  Moving slowly or fidgety/restless '1 2 1  '$ Suicidal thoughts 0 0 0  PHQ-9 Score '5 13 18    '$ GAD 7 : Generalized Anxiety Score 01/19/2018 10/19/2017 09/28/2017  Nervous, Anxious, on Edge '2 2 3  '$ Control/stop worrying '1 2 3  '$ Worry too much - different things '1 2 3  '$ Trouble relaxing '1 2 3  '$ Restless '2 2 3  '$ Easily annoyed or irritable '1 2 3  '$ Afraid - awful might happen '1 2 2    '$ Total GAD 7 Score '9 14 20      '$ Past Medical History:  Diagnosis Date  . Anemia   . Cancer ([place])   . COPD (chronic obstructive pulmonary disease) ([place])   . Thyroid disease    Past Surgical History:  Procedure Laterality Date  . ABDOMINAL HYSTERECTOMY    . THYROIDECTOMY    . TUBAL LIGATION     Social History   Tobacco Use  . Smoking status: Former Smoker    Types: Cigarettes    Last attempt to quit: 07/28/2016    Years since quitting: 1.7  . Smokeless tobacco: Current User    Types: Snuff  Substance Use Topics  . Alcohol use: Not Currently   Family history is unknown by patient.    ROS: negative except as noted in the HPI  Medications: Current Outpatient Medications  Medication Sig Dispense Refill  . acetaminophen (TYLENOL) 650 MG CR tablet Take 1-2 tablets (650-1,300 mg total) by mouth every 8 (eight) hours as needed for pain. 90 tablet 3  . albuterol (VENTOLIN HFA) 108 (90 Base) MCG/ACT inhaler Inhale into the lungs.    Marland Kitchen  amoxicillin-clavulanate (AUGMENTIN) 875-125 MG tablet Take 1 tablet by mouth 2 (two) times daily. 20 tablet 0  . Fluticasone-Umeclidin-Vilant (TRELEGY ELLIPTA) 100-62.5-25 MCG/INH AEPB Inhale 1 puff into the lungs daily. 60 each 5  . gabapentin (NEURONTIN) 100 MG capsule Take 1 capsule (100 mg total) by mouth at bedtime. 90 capsule 1  . haloperidol (HALDOL) 2 MG tablet Take 2 mg by mouth at bedtime.    Marland Kitchen ipratropium-albuterol (DUONEB) 0.5-2.5 (3) MG/3ML SOLN INHALE 1 VIAL VIA NEBULIZER FOUR TIMES DAILY AS NEEDED    . levothyroxine (SYNTHROID, LEVOTHROID) 75 MCG tablet Take 1 tablet (75 mcg total) by mouth daily before breakfast. 90 tablet 1  . loperamide (ANTI-DIARRHEAL) 2 MG tablet Take by mouth as needed.    Marland Kitchen LORazepam (ATIVAN) 0.5 MG tablet Take 1 tablet (0.5 mg total) by mouth once as needed for up to 1 dose for anxiety (DO NOT USE FOR SLEEP). 30 tablet 0  . mirtazapine (REMERON) 15 MG tablet TAKE 1 TABLET BY MOUTH EVERYDAY AT BEDTIME 90 tablet  2  . NONFORMULARY OR COMPOUNDED ITEM Portable battery oxygen concentrator    . NONFORMULARY OR COMPOUNDED ITEM Oxygen and nasal cannula    . NONFORMULARY OR COMPOUNDED ITEM Pulse oximeter, for continuous home use 1 each 0  . ondansetron (ZOFRAN-ODT) 4 MG disintegrating tablet Take 4 mg by mouth every 8 (eight) hours as needed.    . promethazine (PHENERGAN) 25 MG tablet Take 25 mg by mouth as needed.    Marland Kitchen Respiratory Therapy Supplies (NEBULIZER COMPRESSOR) KIT Use a directed    . Respiratory Therapy Supplies (NEBULIZER/ADULT MASK) KIT Use as directed    . senna (SENOKOT) 8.6 MG tablet Take 8.6 mg by mouth as needed.    . tamsulosin (FLOMAX) 0.4 MG CAPS capsule Take 1 capsule (0.4 mg total) by mouth daily. 15 capsule 0   No current facility-administered medications for this visit.    No Known Allergies     Objective:  BP 113/76   Pulse 89   Temp 97.9 F (36.6 C) (Oral)   Wt 143 lb (64.9 kg)   SpO2 91%   BMI 23.80 kg/m  Gen:  alert, not ill-appearing, no distress, appropriate for age 31: head normocephalic without obvious abnormality, conjunctiva and cornea clear, tympanic membrane pearly gray and semi-transparent, external ear canals normal bilaterally, no mastoid tendnerness, oropharynx clear, tonsils absent, uvula midline, neck supple, no cervical adenopathy, trachea midline Pulm: Normal work of breathing, normal phonation Neuro: alert and oriented x 3, no tremor MSK: extremities atraumatic, normal gait and station Skin: approx 2 cm irregular scaly patch with a red base on the right cheek    Study Result   CLINICAL DATA:  Productive cough for several weeks  EXAM: CHEST - 2 VIEW  COMPARISON:  None.  FINDINGS: Cardiac shadows within normal limits. The lungs are hyperinflated consistent with COPD. Mild diffuse interstitial changes are noted likely of a chronic nature. A more focal area of increased density is noted anteriorly on the lateral projection. This is not  well appreciated on the frontal film but may represent a focal infiltrate in the lingula or right middle lobe. No sizable effusion is seen. No bony abnormality is noted.  IMPRESSION: Focal area of increased density best seen anteriorly on the lateral projection. This may lie within the medial aspect of the right middle lobe although not well seen on the frontal projection.  Followup PA and lateral chest X-ray is recommended in 3-4 weeks following trial of antibiotic  therapy to ensure resolution and exclude underlying malignancy.   Electronically Signed   By: Inez Catalina M.D.   On: 02/22/2018 10:49    No results found for this or any previous visit (from the past 72 hour(s)). No results found.    Assessment and Plan: 85 y.o. female with   .Shirl was seen today for follow-up.  Diagnoses and all orders for this visit:  Facial skin lesion -     triamcinolone ointment (KENALOG) 0.5 %; Apply 1 application topically 2 (two) times daily for 14 days. Do not use for more than 2 consecutive weeks  Sore throat -     ipratropium (ATROVENT) 0.06 % nasal spray; Place 2 sprays into both nostrils 4 (four) times daily as needed for rhinitis. -     magic mouthwash w/lidocaine SOLN; Take 15 mLs by mouth 4 (four) times daily as needed for mouth pain. Swish, gargle and spit.   Skin lesion - differential includes inflamed SK, AK and less likely SCC - trial of topical mid-potency corticosteroid cream - close follow-up in 2 weeks - if lesion does not improve, will perform a shave biopsy  Otalgia/Sore throat - benign exam - supportive care with nasal spray and magic mouthwash - follow-up in 2 weeks or sooner if symptoms worsen or fail to improve  RML infiltrate Instructed to go downstairs for repeat CXR today to assess for resolution of infiltrate in RML   Patient education and anticipatory guidance given Patient agrees with treatment plan Follow-up in 2 weeks or sooner as needed  if symptoms worsen or fail to improve  Darlyne Russian PA-C

## 2018-04-26 ENCOUNTER — Encounter: Payer: Self-pay | Admitting: Physician Assistant

## 2018-04-26 DIAGNOSIS — L989 Disorder of the skin and subcutaneous tissue, unspecified: Secondary | ICD-10-CM | POA: Insufficient documentation

## 2018-04-26 DIAGNOSIS — H9201 Otalgia, right ear: Secondary | ICD-10-CM | POA: Insufficient documentation

## 2018-04-28 ENCOUNTER — Other Ambulatory Visit: Payer: Self-pay

## 2018-04-28 MED ORDER — MOUTHWASH COMPOUNDING BASE PO LIQD: 15.0000 mL | Freq: Four times a day (QID) | ORAL | 99 refills | Status: AC

## 2018-04-28 NOTE — Telephone Encounter (Signed)
Sheri Contreras was sent in magic mouthwash to [facility]. The cost is too much. She could get similar mediation through [employer] with Hospice. Nystatin oral solution with 2 % lidocaine with a 1:1 ratio. It just needs to be faxed to the [facility]. The only problem it has to be a doctor that signs the prescription, it can't be a PA.  [phone]

## 2018-04-29 ENCOUNTER — Other Ambulatory Visit: Payer: Self-pay | Admitting: Physician Assistant

## 2018-04-29 DIAGNOSIS — J029 Acute pharyngitis, unspecified: Secondary | ICD-10-CM

## 2018-05-05 ENCOUNTER — Ambulatory Visit: Payer: Medicare Other | Admitting: Physician Assistant

## 2018-05-16 ENCOUNTER — Ambulatory Visit (INDEPENDENT_AMBULATORY_CARE_PROVIDER_SITE_OTHER): Payer: Medicare Other | Admitting: Physician Assistant

## 2018-05-16 ENCOUNTER — Encounter: Payer: Self-pay | Admitting: Physician Assistant

## 2018-05-16 VITALS — BP 147/85 | HR 96 | Temp 98.2°F | Resp 18 | Wt 139.0 lb

## 2018-05-16 DIAGNOSIS — N289 Disorder of kidney and ureter, unspecified: Secondary | ICD-10-CM | POA: Diagnosis not present

## 2018-05-16 DIAGNOSIS — Z515 Encounter for palliative care: Secondary | ICD-10-CM

## 2018-05-16 DIAGNOSIS — R221 Localized swelling, mass and lump, neck: Secondary | ICD-10-CM

## 2018-05-16 DIAGNOSIS — C320 Malignant neoplasm of glottis: Secondary | ICD-10-CM | POA: Diagnosis not present

## 2018-05-16 DIAGNOSIS — H9201 Otalgia, right ear: Secondary | ICD-10-CM

## 2018-05-16 DIAGNOSIS — R070 Pain in throat: Secondary | ICD-10-CM | POA: Insufficient documentation

## 2018-05-16 NOTE — Progress Notes (Signed)
HPI:                                                                Sheri Contreras is a 85 y.o. 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 This has been present for approx 2 months. She has chronic hoarseness related to prior radiation. She reports some fullness in her right upper neck just below the jaw line and states she can compress that area when she swallows to reduce her pain. No dysphagia or choking. No improvement with magic mouthwash and atrovent nasal spray. 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 She quit smoking approx 1 year ago.  She reports the rash on her face has improved significantly with topical Triamcinolone.   Past Medical History:  Diagnosis Date  . Anemia   . Cancer ([place])   . COPD (chronic obstructive pulmonary disease) ([place])   . Thyroid disease    Past Surgical History:  Procedure Laterality Date  . ABDOMINAL HYSTERECTOMY    . THYROIDECTOMY    . TUBAL LIGATION     Social History   Tobacco Use  . Smoking status: Former Smoker    Types: Cigarettes    Last attempt to quit: 07/28/2016    Years since quitting: 1.8  . Smokeless tobacco: Current User    Types: Snuff  Substance Use Topics  . Alcohol use: Not Currently   Family history is unknown by patient.    ROS: negative except as noted in the HPI  Medications: Current Outpatient Medications  Medication Sig Dispense Refill  . acetaminophen (TYLENOL) 650 MG CR tablet Take 1-2 tablets (650-1,300 mg total) by mouth every 8  (eight) hours as needed for pain. 90 tablet 3  . albuterol (VENTOLIN HFA) 108 (90 Base) MCG/ACT inhaler Inhale into the lungs.    . Fluticasone-Umeclidin-Vilant (TRELEGY ELLIPTA) 100-62.5-25 MCG/INH AEPB Inhale 1 puff into the lungs daily. 60 each 5  . gabapentin (NEURONTIN) 100 MG capsule Take 1 capsule (100 mg total) by mouth at bedtime. 90 capsule 1  . haloperidol (HALDOL) 2 MG tablet Take 2 mg by mouth at bedtime.    Marland Kitchen ipratropium (ATROVENT) 0.06 % nasal spray USE 2 SPRAYS IN EACH NOSTRIL FOUR TIMES DAILY AS NEEDED FOR RHINITIS 15 mL 0  . ipratropium-albuterol (DUONEB) 0.5-2.5 (3) MG/3ML SOLN INHALE 1 VIAL VIA NEBULIZER FOUR TIMES DAILY AS NEEDED    . levothyroxine (SYNTHROID, LEVOTHROID) 75 MCG tablet Take 1 tablet (75 mcg total) by mouth daily before breakfast. 90 tablet 1  . loperamide (ANTI-DIARRHEAL) 2 MG tablet Take by mouth as needed.    Marland Kitchen LORazepam (ATIVAN) 0.5 MG tablet Take 1 tablet (0.5 mg total) by mouth once as needed for up to 1 dose for anxiety (DO NOT USE FOR SLEEP). 30 tablet 0  . magic mouthwash w/lidocaine SOLN Take 15 mLs by mouth 4 (four) times daily as needed  for mouth pain. Swish, gargle and spit. 500 mL 0  . mirtazapine (REMERON) 15 MG tablet TAKE 1 TABLET BY MOUTH EVERYDAY AT BEDTIME 90 tablet 2  . Mouthwash Compounding Base LIQD Take 15 mLs by mouth 4 (four) times daily. Swish, gargle and spit. Nystatin oral solution with 2 % lidocaine. 1:1 ratio 1 Bottle prn  . NONFORMULARY OR COMPOUNDED ITEM Portable battery oxygen concentrator    . NONFORMULARY OR COMPOUNDED ITEM Oxygen and nasal cannula    . NONFORMULARY OR COMPOUNDED ITEM Pulse oximeter, for continuous home use 1 each 0  . ondansetron (ZOFRAN-ODT) 4 MG disintegrating tablet Take 4 mg by mouth every 8 (eight) hours as needed.    . promethazine (PHENERGAN) 25 MG tablet Take 25 mg by mouth as needed.    Marland Kitchen Respiratory Therapy Supplies (NEBULIZER COMPRESSOR) KIT Use a directed    . Respiratory Therapy Supplies  (NEBULIZER/ADULT MASK) KIT Use as directed    . senna (SENOKOT) 8.6 MG tablet Take 8.6 mg by mouth as needed.    . tamsulosin (FLOMAX) 0.4 MG CAPS capsule Take 1 capsule (0.4 mg total) by mouth daily. 15 capsule 0   No current facility-administered medications for this visit.    No Known Allergies     Objective:  BP (!) 172/79   Pulse 96   Wt 139 lb (63 kg)   BMI 23.13 kg/m  Physical Exam  HENT:  Mouth/Throat: Uvula is midline, oropharynx is clear and moist and mucous membranes are normal. No posterior oropharyngeal edema.  Tonsils absent  Neck: Trachea normal.  Lymphadenopathy:       Head (right side): No submandibular, no tonsillar and no preauricular adenopathy present.       Head (left side): No submandibular, no tonsillar and no preauricular adenopathy present.       Right cervical: No superficial cervical and no posterior cervical adenopathy present.      Left cervical: No superficial cervical and no posterior cervical adenopathy present.   Skin:  04/21/2018    Lab Results  Component Value Date   CREATININE 0.93 (H) 02/22/2018   BUN 13 02/22/2018   NA 138 02/22/2018   K 3.6 02/22/2018   CL 101 02/22/2018   CO2 33 (H) 02/22/2018      No results found for this or any previous visit (from the past 72 hour(s)). No results found.    Assessment and Plan: 85 y.o. female with   .Angelia was seen today for follow-up.  Diagnoses and all orders for this visit:  Squamous cell carcinoma of vocal cord ([place]) -     Ambulatory referral to ENT -     Cancel: CT Soft Tissue Neck W Contrast; Future -     CT Soft Tissue Neck W Contrast; Future  Throat pain -     Ambulatory referral to ENT -     Cancel: CT Soft Tissue Neck W Contrast; Future -     CT Soft Tissue Neck W Contrast; Future  Localized swelling, mass or lump of neck -     Cancel: CT Soft Tissue Neck W Contrast; Future -     CT Soft Tissue Neck W Contrast; Future  Renal insufficiency -     BASIC  METABOLIC PANEL WITH GFR  Otalgia, right ear  Hospice care patient   Right-sided throat pain Persistent pain x approx 2 months, failed supportive care Given hx of malignancy will obtain CT neck w/contrast and refer back to ENT Patient is on hospice  and does not think she would want radiation, chemotherapy or surgery but she would like to know if there is cancer in her neck  Facial skin lesion Most likely inflamed SK Good response to topical triamcinolone Will defer skin biopsy for now Active surveillance  Patient education and anticipatory guidance given Patient agrees with treatment plan Follow-up as needed if symptoms worsen or fail to improve  Darlyne Russian PA-C

## 2018-05-16 NOTE — Patient Instructions (Addendum)
You have an appointment tomorrow at 9:15 am with Dr. Jodi Mourning [facility] [address] [place], [place], [place] [phone]  [facility] for Oncologist (in case this is needed) Pieter Partridge, [facility]  [place]  [place], [place] [address]  [phone]

## 2018-05-17 LAB — BASIC METABOLIC PANEL WITH GFR
BUN: 17 mg/dL (ref 7–25)
CO2: 29 mmol/L (ref 20–32)
Calcium: 9.2 mg/dL (ref 8.6–10.4)
Chloride: 102 mmol/L (ref 98–110)
Creat: 0.83 mg/dL (ref 0.60–0.88)
GFR, Est African American: 75 mL/min/{1.73_m2} (ref >=60)
GFR, Est Non African American: 64 mL/min/{1.73_m2} (ref >=60)
Glucose, Bld: 109 mg/dL — ABNORMAL HIGH (ref 65–99)
POTASSIUM: 4.2 mmol/L (ref 3.5–5.3)
Sodium: 140 mmol/L (ref 135–146)

## 2018-07-22 ENCOUNTER — Other Ambulatory Visit: Payer: Self-pay | Admitting: Physician Assistant

## 2018-07-22 ENCOUNTER — Telehealth: Payer: Self-pay | Admitting: Neurology

## 2018-07-22 ENCOUNTER — Ambulatory Visit (INDEPENDENT_AMBULATORY_CARE_PROVIDER_SITE_OTHER): Admitting: Physician Assistant

## 2018-07-22 ENCOUNTER — Encounter: Payer: Self-pay | Admitting: Physician Assistant

## 2018-07-22 VITALS — BP 161/85 | HR 92 | Temp 98.2°F | Ht 65.0 in | Wt 148.0 lb

## 2018-07-22 DIAGNOSIS — Z515 Encounter for palliative care: Secondary | ICD-10-CM

## 2018-07-22 DIAGNOSIS — H6981 Other specified disorders of Eustachian tube, right ear: Secondary | ICD-10-CM

## 2018-07-22 DIAGNOSIS — J441 Chronic obstructive pulmonary disease with (acute) exacerbation: Secondary | ICD-10-CM

## 2018-07-22 MED ORDER — FLUTICASONE PROPIONATE 50 MCG/ACT NA SUSP: 2.0000 | Freq: Every day | NASAL | 1 refills | Status: DC

## 2018-07-22 MED ORDER — PREDNISONE 20 MG PO TABS: ORAL_TABLET | ORAL | 0 refills | Status: DC

## 2018-07-22 MED ORDER — AZITHROMYCIN 250 MG PO TABS: ORAL_TABLET | ORAL | 0 refills | Status: DC

## 2018-07-22 NOTE — Progress Notes (Signed)
Subjective:    Patient ID: Sheri Contreras, female    DOB: 1930-05-01, 85 y.o.   MRN: [id number]  HPI  Pt is a 85 yo female with complex medical hx of COPD with respiratory failure, metastatic squamous cell carcinoma on the vocal cords, lymphoma, anemia hypothyroidism on hospice.   She continues to have right sided jaw, ear and throat pain. This has been going on since the beginning of the year. She saw Sheri Contreras her pcp last in february and CT of neck was order. CT results did not show any cancer other than of the vocal cords to be causing the pain. She has COPD and does think her breathing has been a little more labored and cough a little more productive. She denies any fever, chills, nausea or vomiting.   Active Ambulatory Problems    Diagnosis Date Noted  . Arthritis 09/28/2017  . Benign microscopic hematuria 06/12/2011  . Blood loss anemia 03/08/2016  . Breast mass, left 08/04/2016  . COPD (chronic obstructive pulmonary disease) ([place]) 09/28/2017  . Depression, major 02/02/2012  . Essential hypertension 06/12/2011  . Weakness generalized 03/08/2016  . Follicular lymphoma ([place]) 08/26/2016  . GI bleeding 03/08/2016  . Hoarseness 08/10/2011  . Hyperlipidemia 09/28/2017  . Malignant neoplasm of tail of pancreas ([place]) 08/04/2016  . Squamous cell carcinoma of vocal cord ([place]) 11/24/2011  . Carcinoma in situ of larynx 09/28/2017  . Anemia of chronic disease 10/01/2017  . Hospice care patient 10/16/2017  . Chronic respiratory failure with hypoxia ([place]) 10/16/2017  . Poor appetite 10/16/2017  . History of UTI 10/16/2017  . Postsurgical hypothyroidism 10/16/2017  . Dysthymia 10/19/2017  . Anxiety disorder due to known physiological condition 10/19/2017  . Left flank pain 01/04/2018  . Primary osteoarthritis involving multiple joints 01/20/2018  . Pain of right upper extremity 02/22/2018  . History of GI bleed 02/28/2018  . Facial skin lesion 04/26/2018  . Otalgia, right ear  04/26/2018  . Throat pain 05/16/2018   Resolved Ambulatory Problems    Diagnosis Date Noted  . Acute on chronic respiratory failure with hypoxia ([place]) 08/21/2016  . Nontoxic multinodular goiter 01/16/2008  . Other and combined forms of senile cataract 11/07/2015  . Community acquired pneumonia of right middle lobe of lung ([place]) 03/02/2018   Past Medical History:  Diagnosis Date  . Anemia   . Cancer ([place])   . Thyroid disease      Review of Systems See HPI.     Objective:   Physical Exam Vitals signs reviewed.  HENT:     Head: Normocephalic.     Right Ear: Tympanic membrane, ear canal and external ear normal.     Left Ear: Tympanic membrane, ear canal and external ear normal.     Nose: Nose normal.     Mouth/Throat:     Mouth: Mucous membranes are moist.  Eyes:     Conjunctiva/sclera: Conjunctivae normal.  Neck:     Musculoskeletal: Muscular tenderness present.     Comments: Right sided jaw line and just under the jaw line tenderness.  Cardiovascular:     Rate and Rhythm: Normal rate.  Pulmonary:     Comments: Coarse breath sounds. No wheezing. Scattered rhonchi. Productive cough on exam.  Abdominal:     Palpations: Abdomen is soft.     Tenderness: There is no abdominal tenderness.  Lymphadenopathy:     Cervical: No cervical adenopathy.  Neurological:     General: No focal deficit present.  Mental Status: She is alert and oriented to person, place, and time.  Psychiatric:        Mood and Affect: Mood normal.           Assessment & Plan:  Marland KitchenMarland KitchenDaneisha was seen today for uri.  Diagnoses and all orders for this visit:  ETD (Eustachian tube dysfunction), right -     fluticasone (FLONASE) 50 MCG/ACT nasal spray; Place 2 sprays into both nostrils daily. -     predniSONE (DELTASONE) 20 MG tablet; Take 3 tablets for 3 days, take 2 tablets for 3 days, take 1 tablet for 3 days, take 1/2 tablets for 4 days.  COPD exacerbation (HCC) -     azithromycin (ZITHROMAX)  250 MG tablet; Take 2 tablets now and the one tablet for 4 days. -     predniSONE (DELTASONE) 20 MG tablet; Take 3 tablets for 3 days, take 2 tablets for 3 days, take 1 tablet for 3 days, take 1/2 tablets for 4 days.  Hospice care patient   Pulse ox declined from baseline. She does seem to be coughing more treated for COPD exacerbation. zpak and prednisone. Continue to use other preventative inhalers and albuterol as needed.   Reassuring CT of neck normal. Could be some eustachian tube dysfunction. Start flonase. Prednisone taper should help as well. Follow up with PCP in 2 weeks virtually to see if pain has improved.

## 2018-07-22 NOTE — Telephone Encounter (Signed)
[facility] called and needed a diagnosis code for azithromycin. Called [phone] and gave them codes H69.81 and J44.1.

## 2018-08-02 MED ORDER — INSULIN LISPRO 100 UNIT/ML ~~LOC~~ SOLN
1.00 | SUBCUTANEOUS | Status: DC
Start: 2018-08-02 — End: 2018-08-02

## 2018-08-02 MED ORDER — LISINOPRIL 5 MG PO TABS
5.00 | ORAL_TABLET | ORAL | Status: DC
Start: 2018-08-03 — End: 2018-08-02

## 2018-08-02 MED ORDER — GLUCOSE 40 % PO GEL
15.00 | ORAL | Status: DC
Start: ? — End: 2018-08-02

## 2018-08-02 MED ORDER — VANCOMYCIN HCL IN DEXTROSE 1-5 GM/200ML-% IV SOLN
15.00 | INTRAVENOUS | Status: DC
Start: 2018-08-02 — End: 2018-08-02

## 2018-08-02 MED ORDER — GENERIC EXTERNAL MEDICATION
750.00 | Status: DC
Start: 2018-08-02 — End: 2018-08-02

## 2018-08-02 MED ORDER — GABAPENTIN 100 MG PO CAPS
200.00 | ORAL_CAPSULE | ORAL | Status: DC
Start: 2018-08-02 — End: 2018-08-02

## 2018-08-02 MED ORDER — ONDANSETRON HCL 4 MG PO TABS
4.00 | ORAL_TABLET | ORAL | Status: DC
Start: ? — End: 2018-08-02

## 2018-08-02 MED ORDER — PANTOPRAZOLE SODIUM 40 MG PO TBEC
40.00 | DELAYED_RELEASE_TABLET | ORAL | Status: DC
Start: 2018-08-03 — End: 2018-08-02

## 2018-08-02 MED ORDER — MIRTAZAPINE 15 MG PO TABS
15.00 | ORAL_TABLET | ORAL | Status: DC
Start: 2018-08-02 — End: 2018-08-02

## 2018-08-02 MED ORDER — HYDROCODONE-HOMATROPINE 5-1.5 MG/5ML PO SYRP
5.00 | ORAL_SOLUTION | ORAL | Status: DC
Start: ? — End: 2018-08-02

## 2018-08-02 MED ORDER — LORAZEPAM 0.5 MG PO TABS
0.50 | ORAL_TABLET | ORAL | Status: DC
Start: ? — End: 2018-08-02

## 2018-08-02 MED ORDER — DEXTROSE 10 % IV SOLN
125.00 | INTRAVENOUS | Status: DC
Start: ? — End: 2018-08-02

## 2018-08-02 MED ORDER — ALBUTEROL SULFATE (2.5 MG/3ML) 0.083% IN NEBU
2.50 | INHALATION_SOLUTION | RESPIRATORY_TRACT | Status: DC
Start: ? — End: 2018-08-02

## 2018-08-02 MED ORDER — LEVOTHYROXINE SODIUM 75 MCG PO TABS
75.00 | ORAL_TABLET | ORAL | Status: DC
Start: 2018-08-03 — End: 2018-08-02

## 2018-08-19 ENCOUNTER — Telehealth: Payer: Self-pay

## 2018-08-19 NOTE — Telephone Encounter (Signed)
Sheri Contreras with [facility] called and requested a new order for oxygen 3-5 liters and a refill on the DuoNeb. Gave verbal for both.    [facility] in [place], [place]  Address: [address] [place], [place] [address]  Phone: [phone]

## 2018-09-01 ENCOUNTER — Other Ambulatory Visit: Payer: Self-pay | Admitting: Physician Assistant

## 2018-09-22 ENCOUNTER — Other Ambulatory Visit: Payer: Self-pay | Admitting: Physician Assistant

## 2018-09-22 MED ORDER — LISINOPRIL 5 MG PO TABS: 5.0000 mg | ORAL_TABLET | Freq: Every day | ORAL | 1 refills | Status: AC

## 2018-10-07 ENCOUNTER — Telehealth: Payer: Self-pay

## 2018-10-07 NOTE — Telephone Encounter (Signed)
I can't prescribe her antibiotics without evaluating her myself. Do they have an NP/prescriber who can evaluate Sheri Contreras and order antibiotics? If not, then I would recommend she is evaluated in urgent care/ER

## 2018-10-07 NOTE — Telephone Encounter (Signed)
[name] RN with [facility] called and left a message. She states Sheri Contreras is is complaining of coughing up green sputum and has a terrible sore throat. She suggested an antibiotic and viscous lidocaine for her throat pain. Please advise.    [facility] in [place], [place] Address: [address], [place], [place] [address] Phone: [phone]

## 2018-10-07 NOTE — Telephone Encounter (Signed)
[name],RN will have one of the hospice providers send in something.

## 2018-10-19 ENCOUNTER — Telehealth: Payer: Self-pay

## 2018-10-19 NOTE — Telephone Encounter (Signed)
Similar complaint/concern on 10/07/2018.  Charley recommended evaluation by emergency department if needed or if there is concern for need for antibiotics, we can send nausea medicine or cough medicine if desired.

## 2018-10-19 NOTE — Telephone Encounter (Signed)
[name] from [facility] called to report that when she went to go check on patient, she reported having nausea and vomiting yesterday. Today patient was having thick mucous and a productive cough that is green and blood tinged. No fevers, chills, or body aches.  [name] wanted to call and report to see if provider had any recommendations.   Sheri Contreras patient, sending note to covering provider, Dr Izola Price CB# [phone]

## 2018-10-20 NOTE — Telephone Encounter (Signed)
Left Sheri Contreras on her ID'd VM with Dr Redgie Grayer recommendation

## 2018-10-28 ENCOUNTER — Ambulatory Visit: Payer: Medicare Other | Admitting: Physician Assistant

## 2018-11-14 ENCOUNTER — Telehealth: Payer: Self-pay

## 2018-11-14 ENCOUNTER — Other Ambulatory Visit: Payer: Self-pay

## 2018-11-14 MED ORDER — LORAZEPAM 0.5 MG PO TABS: 1.0000 mg | ORAL_TABLET | ORAL | 0 refills | Status: AC | PRN

## 2018-11-14 NOTE — Telephone Encounter (Signed)
Med list updated. -EH/RMA

## 2018-11-21 MED ORDER — GENERIC EXTERNAL MEDICATION
Status: DC
Start: ? — End: 2018-11-21

## 2018-11-21 MED ORDER — PANTOPRAZOLE SODIUM 40 MG PO TBEC
40.00 | DELAYED_RELEASE_TABLET | ORAL | Status: DC
Start: 2018-11-21 — End: 2018-11-21

## 2018-11-21 MED ORDER — BENZONATATE 100 MG PO CAPS
100.00 | ORAL_CAPSULE | ORAL | Status: DC
Start: 2018-11-20 — End: 2018-11-21

## 2018-11-21 MED ORDER — BUDESONIDE-FORMOTEROL FUMARATE 160-4.5 MCG/ACT IN AERO
2.00 | INHALATION_SPRAY | RESPIRATORY_TRACT | Status: DC
Start: 2018-11-20 — End: 2018-11-21

## 2018-11-21 MED ORDER — GABAPENTIN 100 MG PO CAPS
100.00 | ORAL_CAPSULE | ORAL | Status: DC
Start: 2018-11-20 — End: 2018-11-21

## 2018-11-21 MED ORDER — BENZOCAINE-MENTHOL 15-3.6 MG MT LOZG
1.00 | LOZENGE | OROMUCOSAL | Status: DC
Start: ? — End: 2018-11-21

## 2018-11-21 MED ORDER — ALUM & MAG HYDROXIDE-SIMETH 200-200-20 MG/5ML PO SUSP
30.00 | ORAL | Status: DC
Start: ? — End: 2018-11-21

## 2018-11-21 MED ORDER — SODIUM CHLORIDE 0.9 % IV SOLN
10.00 | INTRAVENOUS | Status: DC
Start: ? — End: 2018-11-21

## 2018-11-21 MED ORDER — MORPHINE SULFATE (PF) 2 MG/ML IV SOLN
1.00 | INTRAVENOUS | Status: DC
Start: ? — End: 2018-11-21

## 2018-11-21 MED ORDER — ACETAMINOPHEN 650 MG RE SUPP
650.00 | RECTAL | Status: DC
Start: ? — End: 2018-11-21

## 2018-11-21 MED ORDER — OLANZAPINE 5 MG PO TABS
5.00 | ORAL_TABLET | ORAL | Status: DC
Start: ? — End: 2018-11-21

## 2018-11-21 MED ORDER — MORPHINE SULFATE (CONCENTRATE) 20 MG/ML PO SOLN
5.00 | ORAL | Status: DC
Start: ? — End: 2018-11-21

## 2018-11-21 MED ORDER — ALBUTEROL SULFATE HFA 108 (90 BASE) MCG/ACT IN AERS
2.00 | INHALATION_SPRAY | RESPIRATORY_TRACT | Status: DC
Start: ? — End: 2018-11-21

## 2018-11-21 MED ORDER — GENERIC EXTERNAL MEDICATION
750.00 | Status: DC
Start: 2018-11-21 — End: 2018-11-21

## 2018-11-21 MED ORDER — ACETAMINOPHEN 325 MG PO TABS
650.00 | ORAL_TABLET | ORAL | Status: DC
Start: ? — End: 2018-11-21

## 2018-11-21 MED ORDER — IPRATROPIUM-ALBUTEROL 0.5-2.5 (3) MG/3ML IN SOLN
3.00 | RESPIRATORY_TRACT | Status: DC
Start: ? — End: 2018-11-21

## 2018-11-21 MED ORDER — KETOTIFEN FUMARATE 0.025 % OP SOLN
1.00 | OPHTHALMIC | Status: DC
Start: 2018-11-20 — End: 2018-11-21

## 2018-11-21 MED ORDER — ROPINIROLE HCL 1 MG PO TABS
2.00 | ORAL_TABLET | ORAL | Status: DC
Start: 2018-11-20 — End: 2018-11-21

## 2018-11-21 MED ORDER — NITROGLYCERIN 0.4 MG SL SUBL
0.40 | SUBLINGUAL_TABLET | SUBLINGUAL | Status: DC
Start: ? — End: 2018-11-21

## 2018-11-21 MED ORDER — TRAZODONE HCL 50 MG PO TABS
50.00 | ORAL_TABLET | ORAL | Status: DC
Start: 2018-11-20 — End: 2018-11-21

## 2018-11-21 MED ORDER — OLANZAPINE 5 MG PO TABS
5.00 | ORAL_TABLET | ORAL | Status: DC
Start: 2018-11-21 — End: 2018-11-21

## 2018-11-21 MED ORDER — LEVOTHYROXINE SODIUM 75 MCG PO TABS
75.00 | ORAL_TABLET | ORAL | Status: DC
Start: 2018-11-21 — End: 2018-11-21

## 2018-11-21 MED ORDER — BACLOFEN 10 MG PO TABS
10.00 | ORAL_TABLET | ORAL | Status: DC
Start: 2018-11-20 — End: 2018-11-21

## 2018-12-29 DEATH — deceased

## 2020-02-29 IMAGING — DX DG CHEST 2V
2 series · 2 of 2 positions shown · non-contrast
Comparison: None.

CLINICAL DATA: Productive cough for several weeks

EXAM:
CHEST - 2 VIEW

[chest pa]
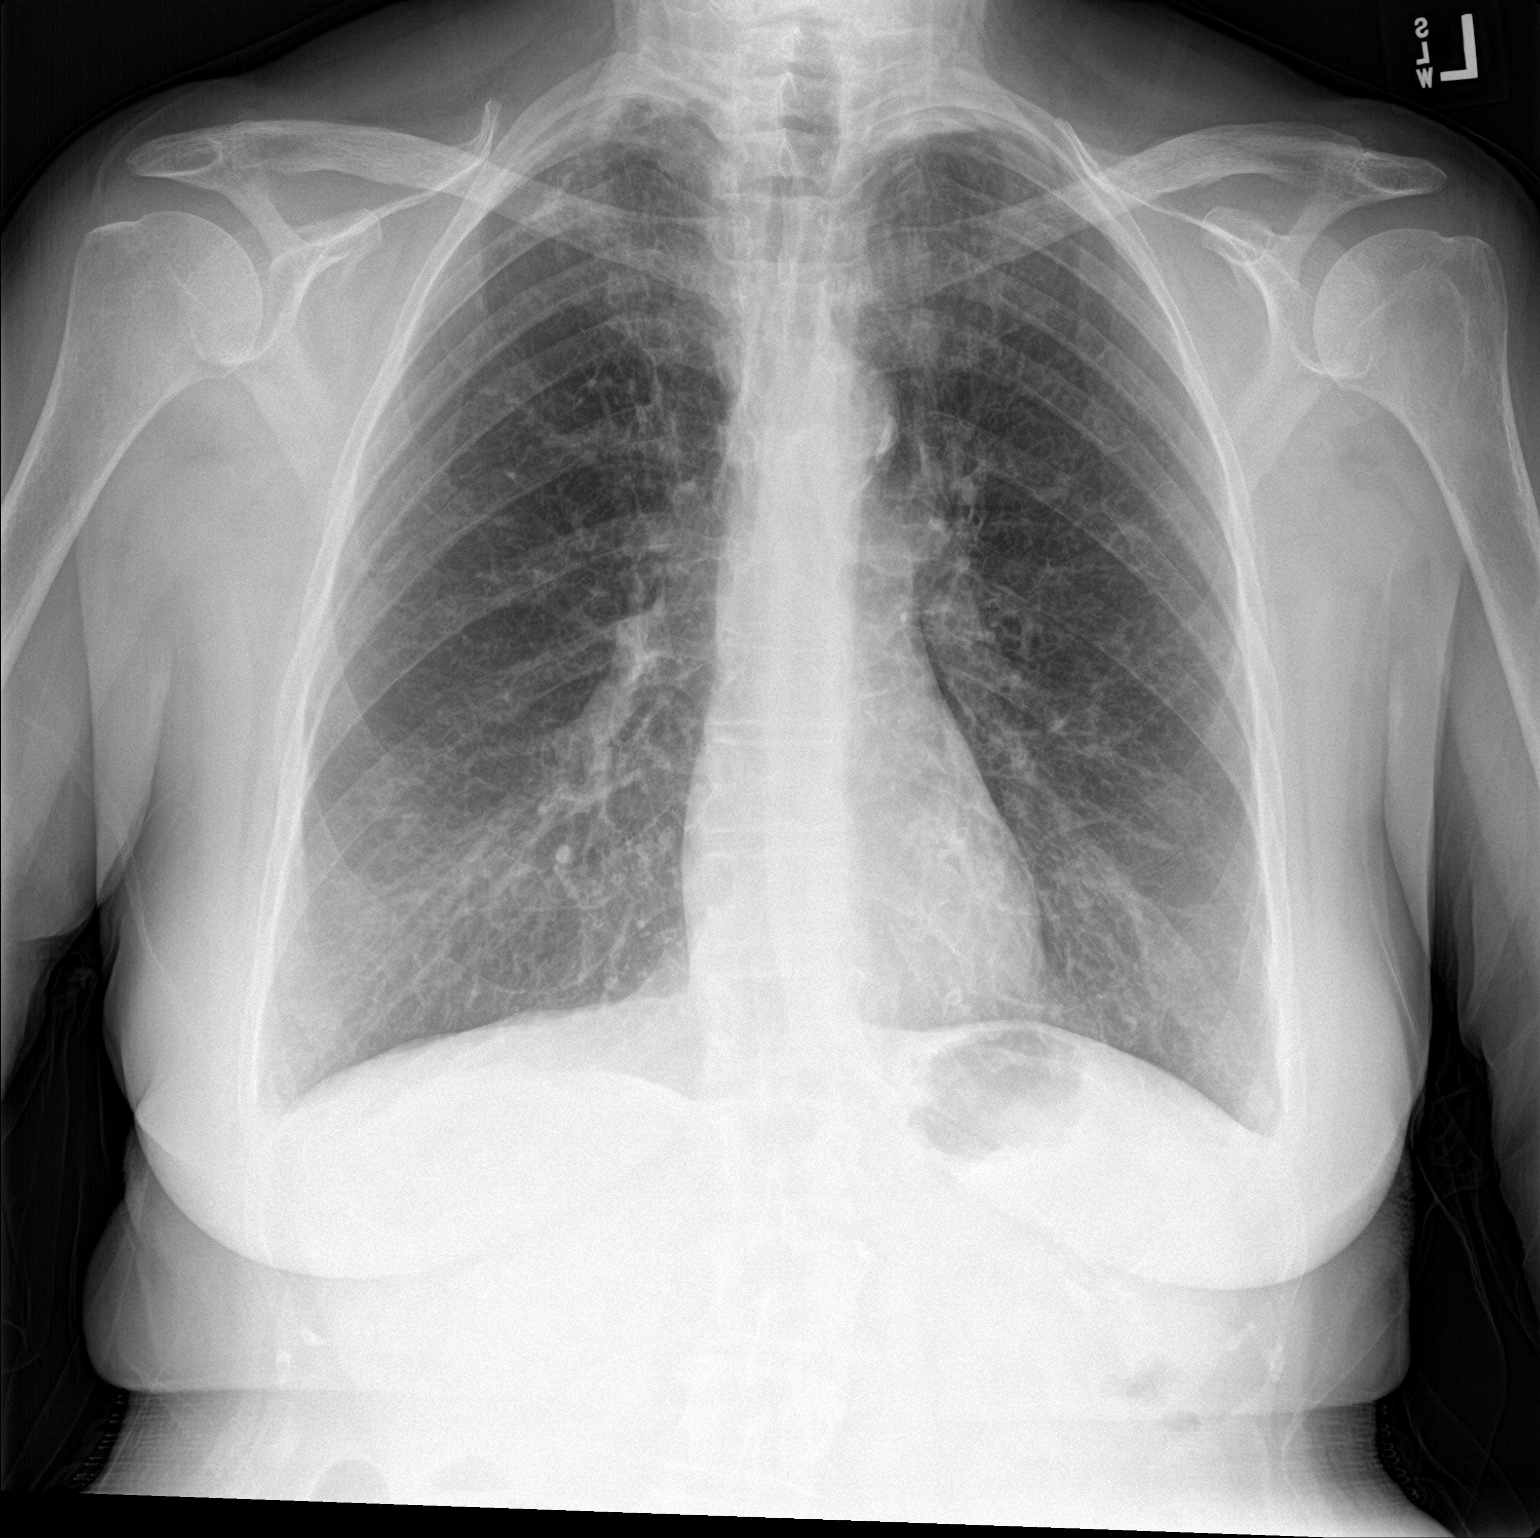

[chest lat]
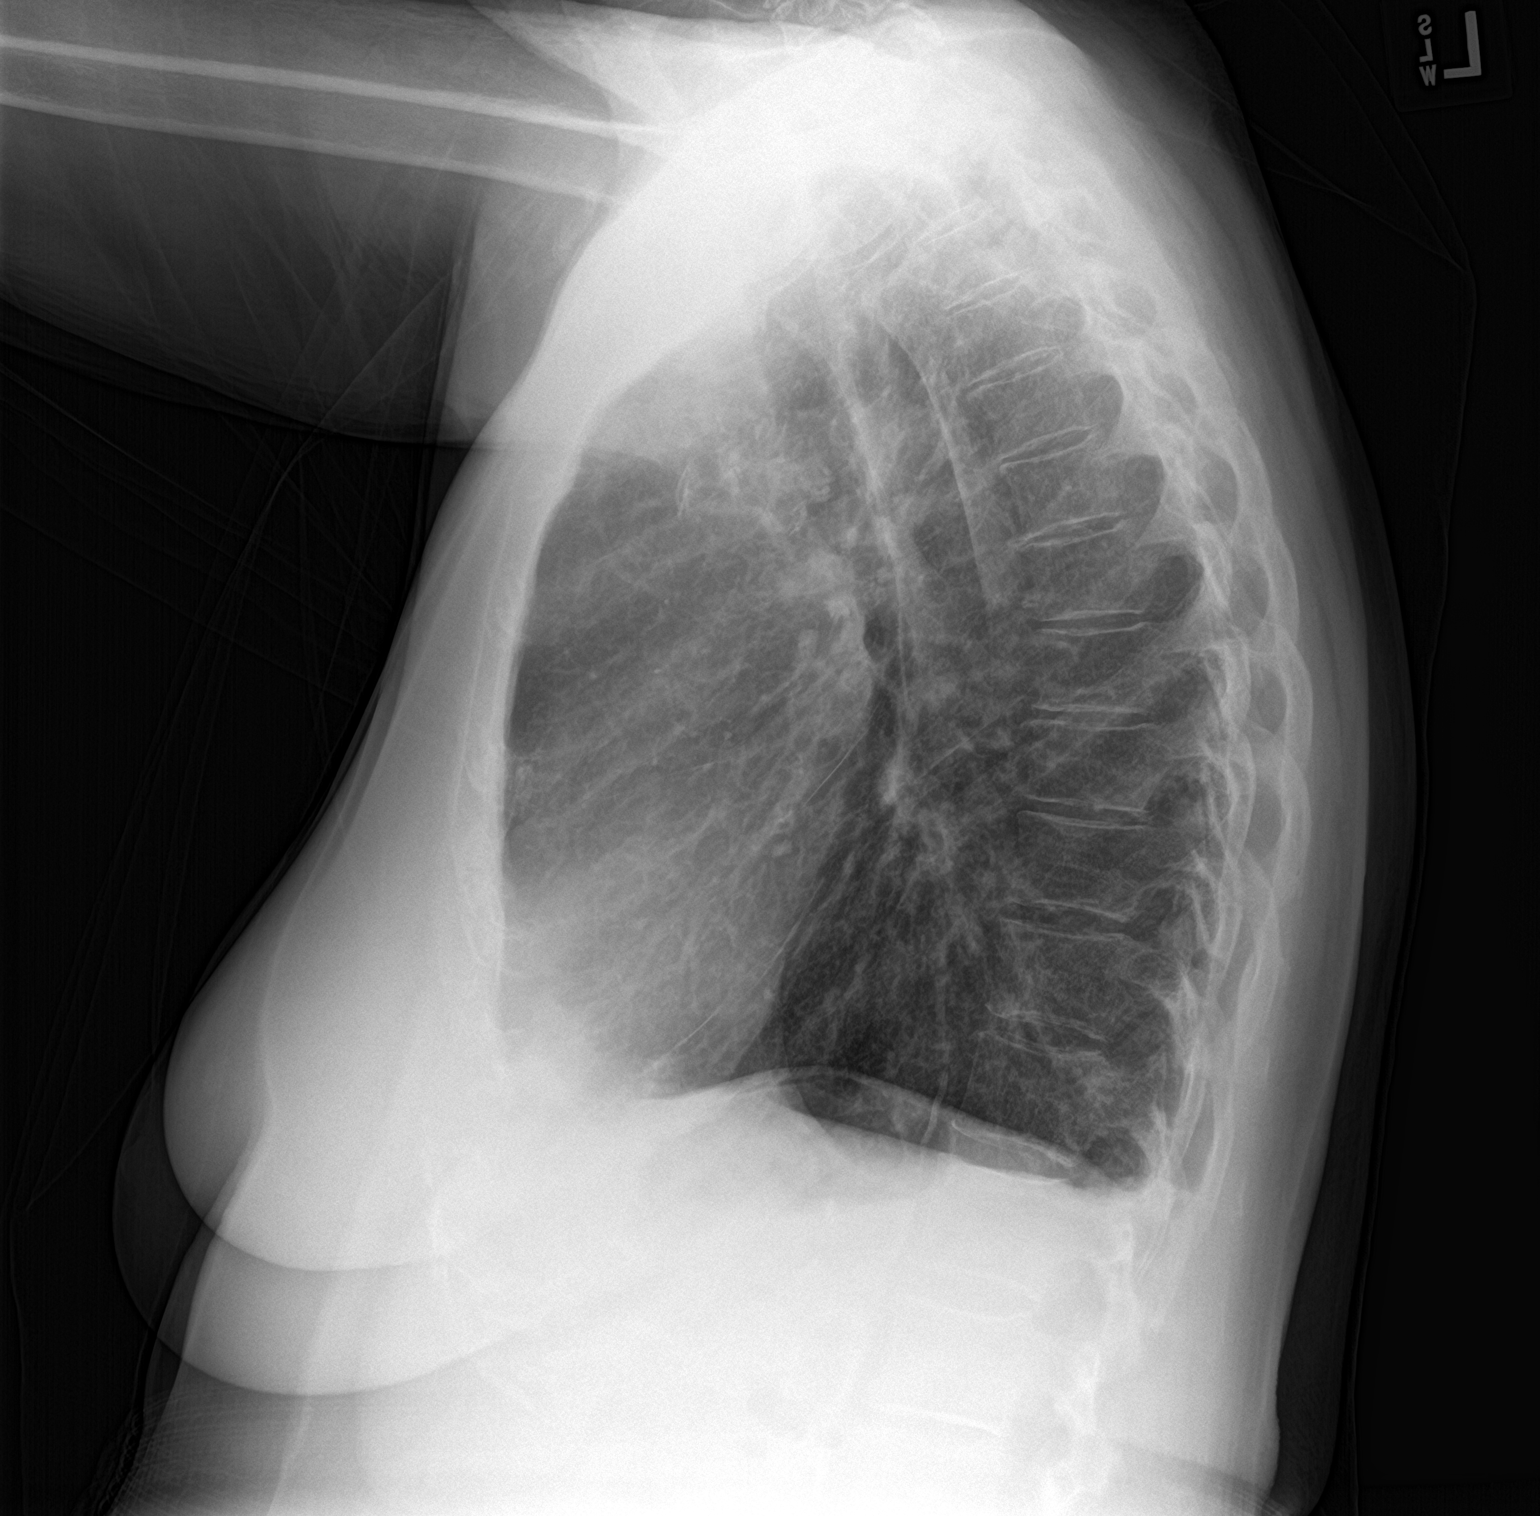

[2 of 2 positions shown; findings below may reference images not displayed]

FINDINGS: Cardiac shadows within normal limits. The lungs are hyperinflated
consistent with COPD. Mild diffuse interstitial changes are noted
likely of a chronic nature. A more focal area of increased density
is noted anteriorly on the lateral projection. This is not well
appreciated on the frontal film but may represent a focal infiltrate
in the lingula or right middle lobe. No sizable effusion is seen. No
bony abnormality is noted.
IMPRESSION: Focal area of increased density best seen anteriorly on the lateral
projection. This may lie within the medial aspect of the right
middle lobe although not well seen on the frontal projection.

Followup PA and lateral chest X-ray is recommended in 3-4 weeks
following trial of antibiotic therapy to ensure resolution and
exclude underlying malignancy.

## 2020-04-27 IMAGING — DX DG CHEST 2V
2 series · 2 of 2 positions shown · non-contrast
Comparison: 02/22/2018.

CLINICAL DATA: COPD. Metastatic squamous cell carcinoma vocal cord.
Lymphoma.

EXAM:
CHEST - 2 VIEW

[chest pa]
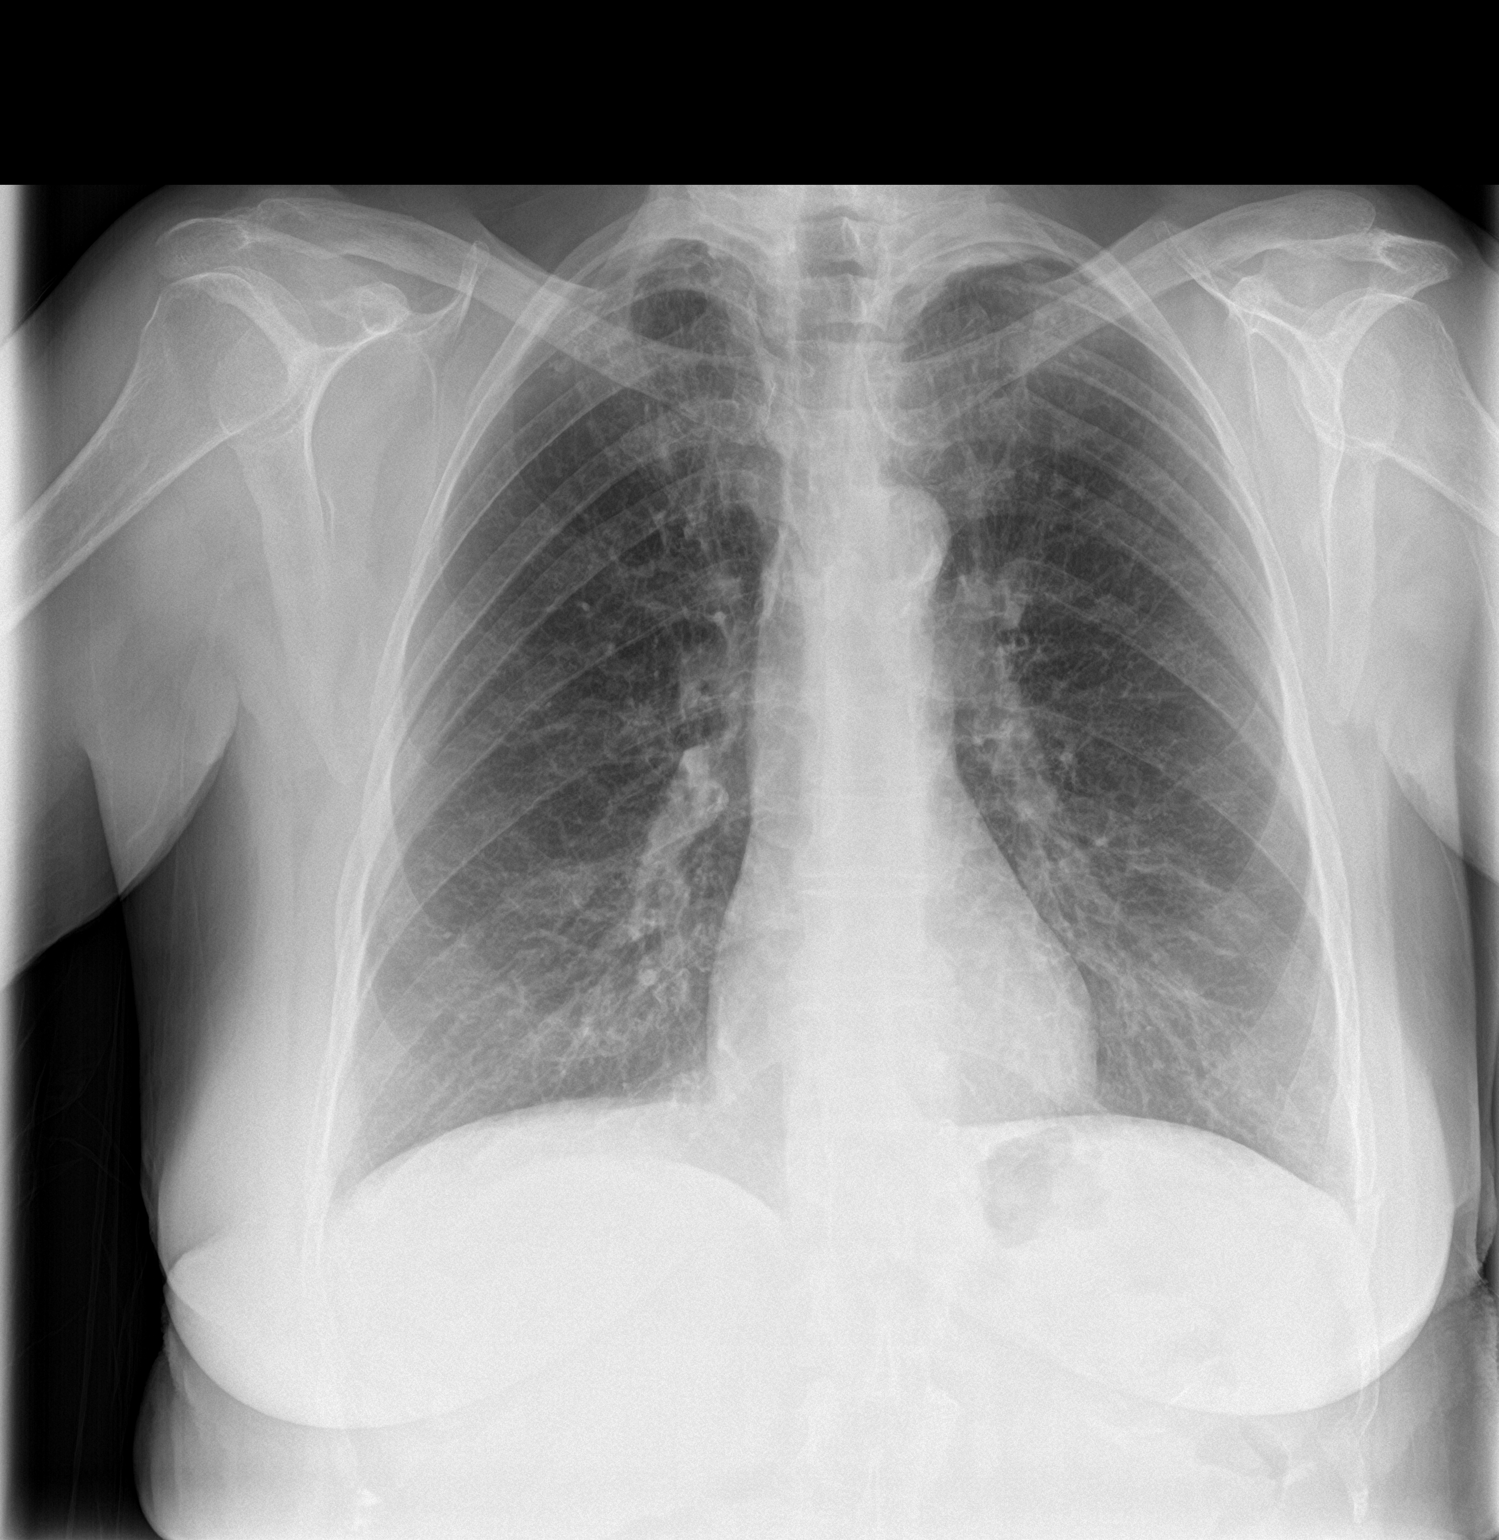

[chest lat]
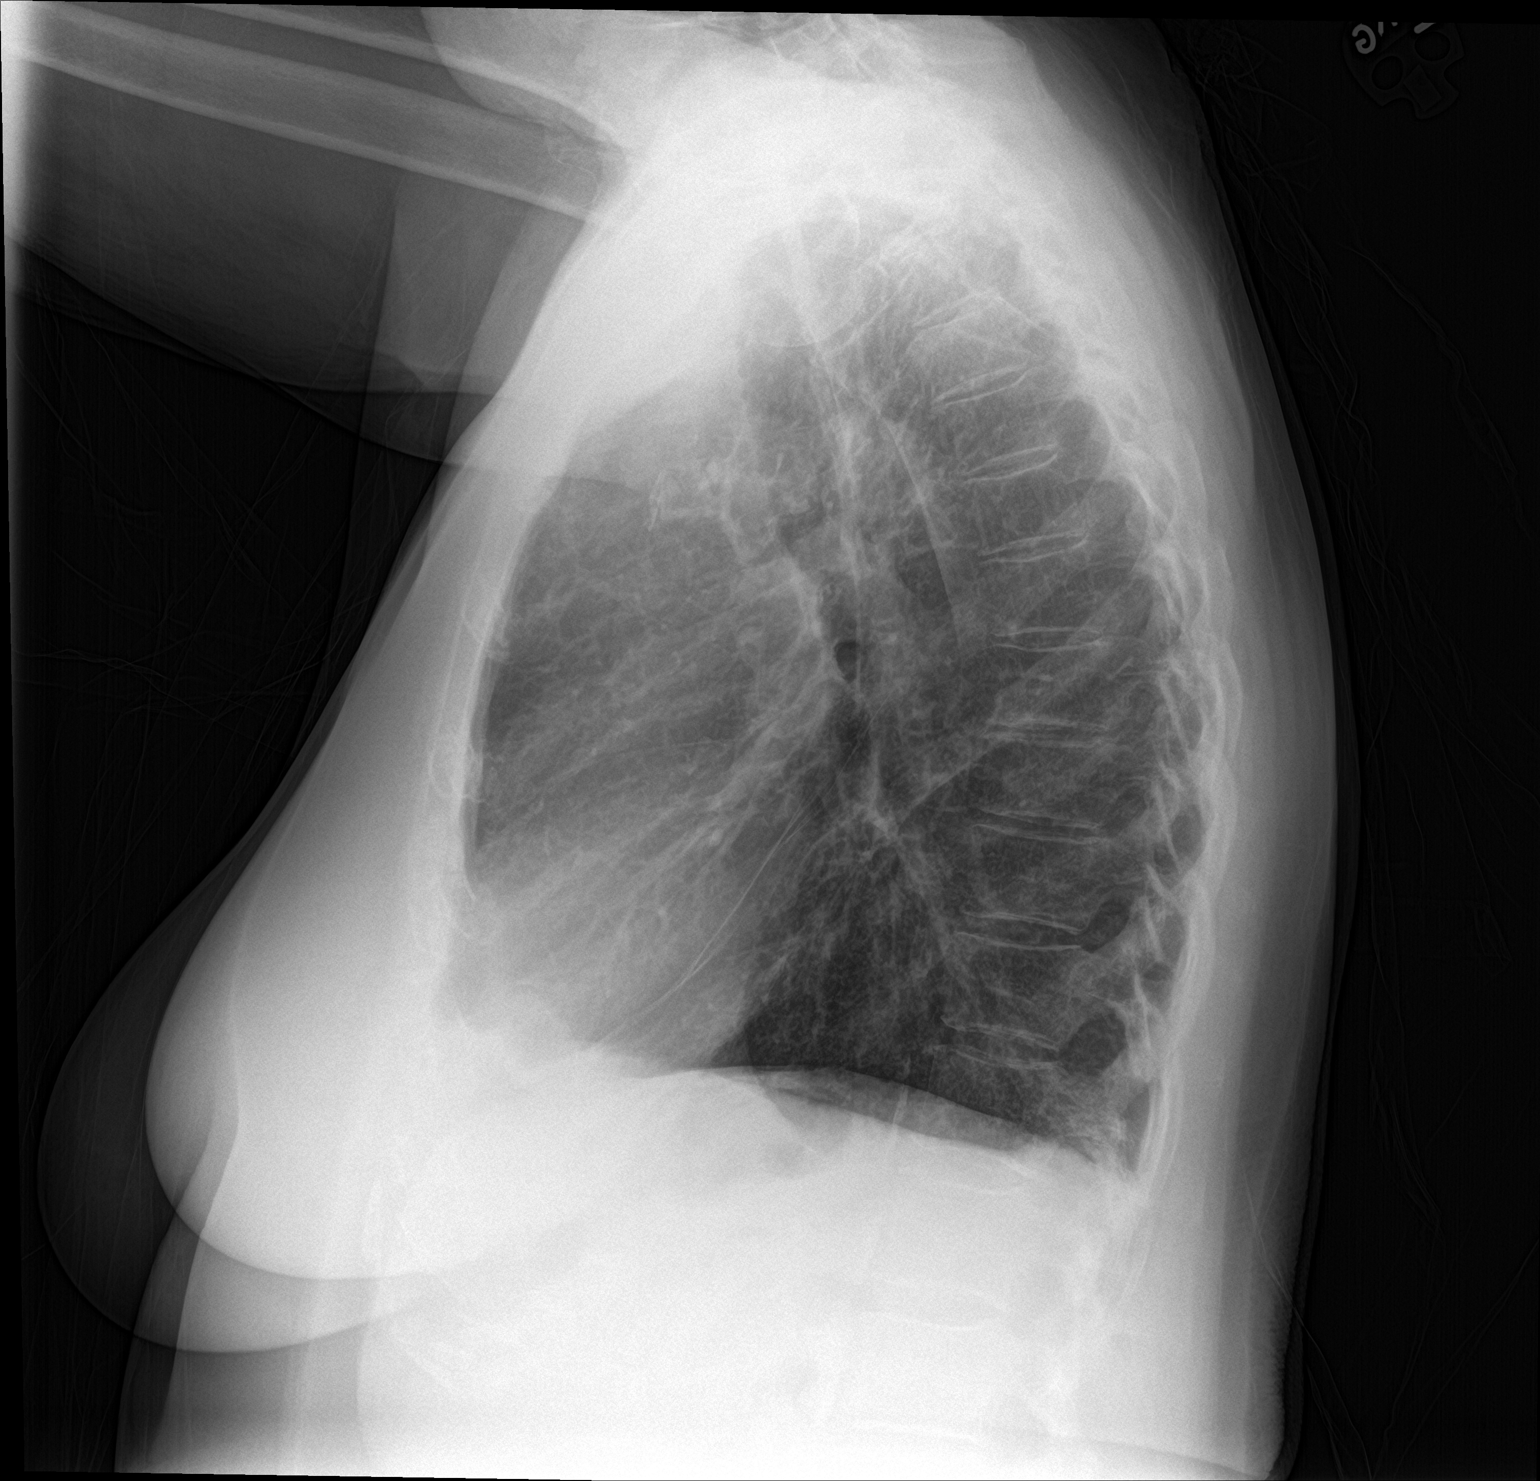

[2 of 2 positions shown; findings below may reference images not displayed]

FINDINGS: Mediastinum and hilar structures normal. Heart size normal. Lungs
are clear of acute infiltrates. Chronic interstitial changes noted.
No pleural effusion or pneumothorax. Biapical pleural-parenchymal
thickening again noted consistent with scarring. No acute bony
abnormality identified. Degenerative change thoracic spine.
IMPRESSION: Chronic interstitial lung disease. No acute abnormality identified.
No focal pulmonary lesions are identified.
